# Patient Record
Sex: Female | Born: 1980 | Race: White | Hispanic: No | Marital: Married | Smoking: Never smoker
Health system: Southern US, Community
[De-identification: ages and names within clinical notes are randomized; demographics above are authoritative.]

## PROBLEM LIST (undated history)

## (undated) DIAGNOSIS — K219 Gastro-esophageal reflux disease without esophagitis: Secondary | ICD-10-CM

## (undated) DIAGNOSIS — G43909 Migraine, unspecified, not intractable, without status migrainosus: Secondary | ICD-10-CM

## (undated) DIAGNOSIS — E7800 Pure hypercholesterolemia, unspecified: Secondary | ICD-10-CM

## (undated) DIAGNOSIS — I219 Acute myocardial infarction, unspecified: Secondary | ICD-10-CM

## (undated) DIAGNOSIS — I639 Cerebral infarction, unspecified: Secondary | ICD-10-CM

## (undated) DIAGNOSIS — Z972 Presence of dental prosthetic device (complete) (partial): Secondary | ICD-10-CM

## (undated) HISTORY — PX: CARDIAC SURGERY: SHX584

## (undated) HISTORY — PX: CHOLECYSTECTOMY: SHX55

---

## 2006-05-27 ENCOUNTER — Emergency Department: Payer: Self-pay | Admitting: Emergency Medicine

## 2006-06-02 ENCOUNTER — Inpatient Hospital Stay: Payer: Self-pay | Admitting: Internal Medicine

## 2008-09-21 ENCOUNTER — Ambulatory Visit: Payer: Self-pay | Admitting: Pain Medicine

## 2008-11-18 ENCOUNTER — Ambulatory Visit: Payer: Self-pay | Admitting: Pain Medicine

## 2008-12-06 ENCOUNTER — Ambulatory Visit: Payer: Self-pay | Admitting: Internal Medicine

## 2009-01-11 ENCOUNTER — Ambulatory Visit: Payer: Self-pay | Admitting: Pain Medicine

## 2009-01-25 ENCOUNTER — Ambulatory Visit: Payer: Self-pay | Admitting: Pain Medicine

## 2009-02-18 ENCOUNTER — Encounter: Payer: Self-pay | Admitting: Pain Medicine

## 2009-03-07 ENCOUNTER — Ambulatory Visit: Payer: Self-pay | Admitting: Internal Medicine

## 2009-03-07 ENCOUNTER — Encounter: Payer: Self-pay | Admitting: Pain Medicine

## 2009-05-06 ENCOUNTER — Ambulatory Visit: Payer: Self-pay | Admitting: Physician Assistant

## 2009-07-16 ENCOUNTER — Ambulatory Visit: Payer: Self-pay | Admitting: Internal Medicine

## 2009-11-17 ENCOUNTER — Emergency Department: Payer: Self-pay | Admitting: Emergency Medicine

## 2009-11-27 ENCOUNTER — Ambulatory Visit: Payer: Self-pay | Admitting: Internal Medicine

## 2010-01-23 ENCOUNTER — Ambulatory Visit: Payer: Self-pay | Admitting: Internal Medicine

## 2010-02-05 ENCOUNTER — Ambulatory Visit: Payer: Self-pay | Admitting: Internal Medicine

## 2010-06-20 ENCOUNTER — Observation Stay: Payer: Self-pay

## 2010-07-13 ENCOUNTER — Observation Stay: Payer: Self-pay

## 2010-07-19 ENCOUNTER — Ambulatory Visit: Payer: Self-pay | Admitting: Advanced Practice Midwife

## 2010-07-22 ENCOUNTER — Observation Stay: Payer: Self-pay

## 2010-07-23 ENCOUNTER — Ambulatory Visit: Payer: Self-pay

## 2010-07-31 ENCOUNTER — Observation Stay: Payer: Self-pay | Admitting: Obstetrics & Gynecology

## 2010-08-07 ENCOUNTER — Ambulatory Visit: Payer: Self-pay | Admitting: Advanced Practice Midwife

## 2010-08-07 DIAGNOSIS — I639 Cerebral infarction, unspecified: Secondary | ICD-10-CM

## 2010-08-07 HISTORY — DX: Cerebral infarction, unspecified: I63.9

## 2010-08-14 ENCOUNTER — Observation Stay: Payer: Self-pay

## 2010-08-30 ENCOUNTER — Observation Stay: Payer: Self-pay | Admitting: Obstetrics & Gynecology

## 2010-09-01 ENCOUNTER — Observation Stay: Payer: Self-pay | Admitting: Obstetrics and Gynecology

## 2010-09-09 ENCOUNTER — Inpatient Hospital Stay: Payer: Self-pay

## 2010-11-06 DIAGNOSIS — I5022 Chronic systolic (congestive) heart failure: Secondary | ICD-10-CM | POA: Insufficient documentation

## 2010-11-06 DIAGNOSIS — H5347 Heteronymous bilateral field defects: Secondary | ICD-10-CM | POA: Insufficient documentation

## 2010-12-02 ENCOUNTER — Observation Stay: Payer: Self-pay | Admitting: Internal Medicine

## 2012-02-21 DIAGNOSIS — G890 Central pain syndrome: Secondary | ICD-10-CM | POA: Insufficient documentation

## 2012-02-21 DIAGNOSIS — H532 Diplopia: Secondary | ICD-10-CM | POA: Insufficient documentation

## 2012-05-08 IMAGING — CT CT CHEST W/ CM
1 of 3 series · 15 of 29 positions shown, 19 images · IV contrast (APPLIED)
Comparison: none

REASON FOR EXAM: elev dd syncope Bolivin pills
COMMENTS:

[Series 10: soft tissue · axial · 0.65mm/px · z∈[-204,+27]mm · 15 of 85 slices shown, 19 images]
[im 4/85  mediastinal]
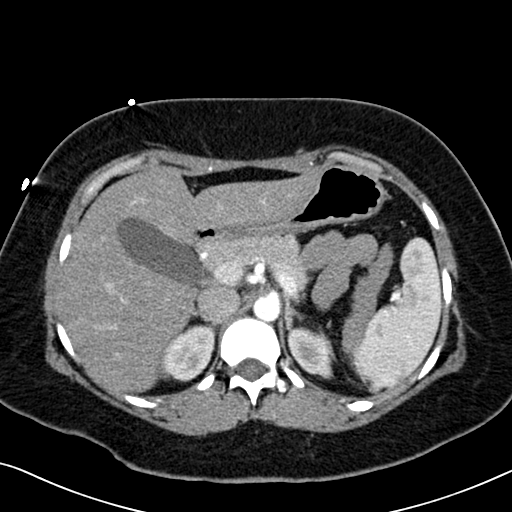
[im 4/85  lung]
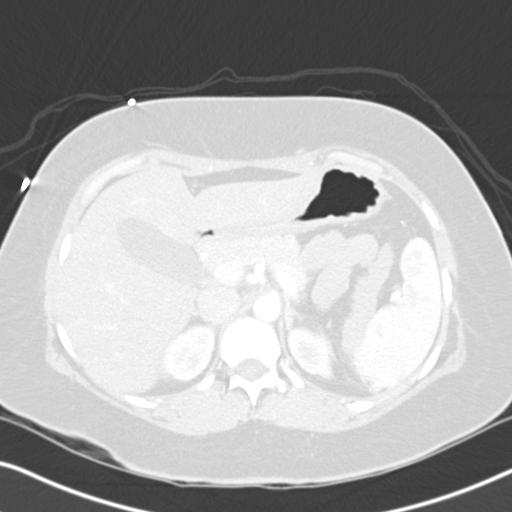
[im 11/85  lung]
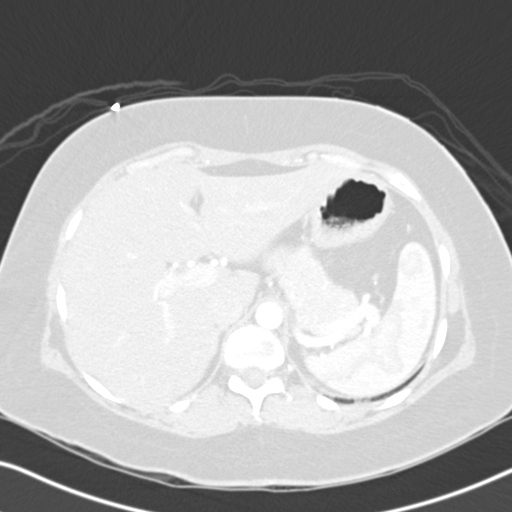
[im 18/85  lung]
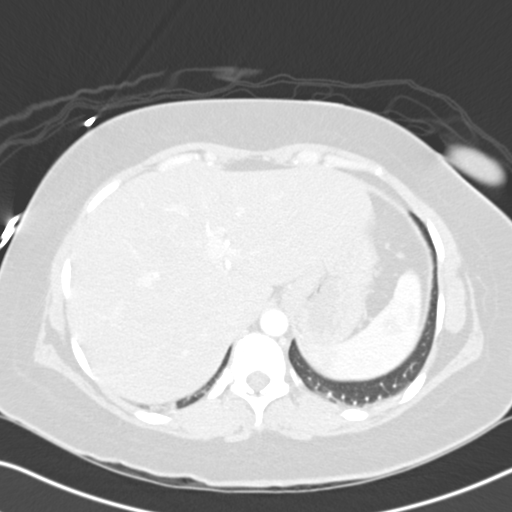
[im 22/85  lung]
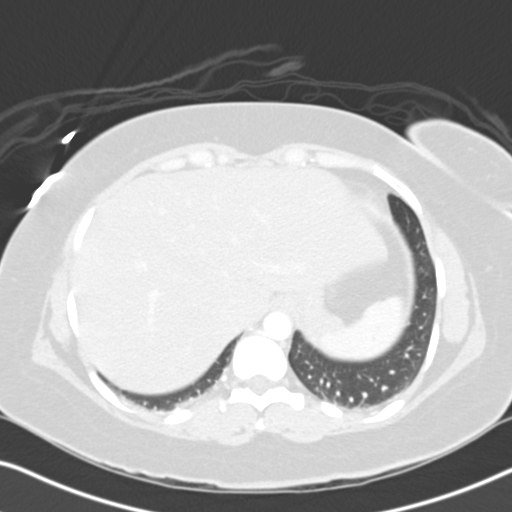
[im 29/85  mediastinal]
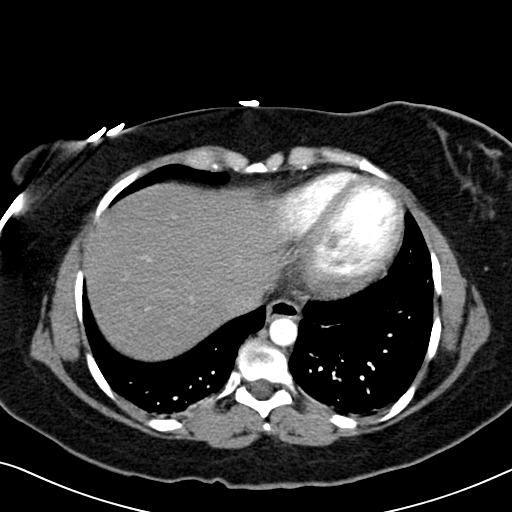
[im 29/85  lung]
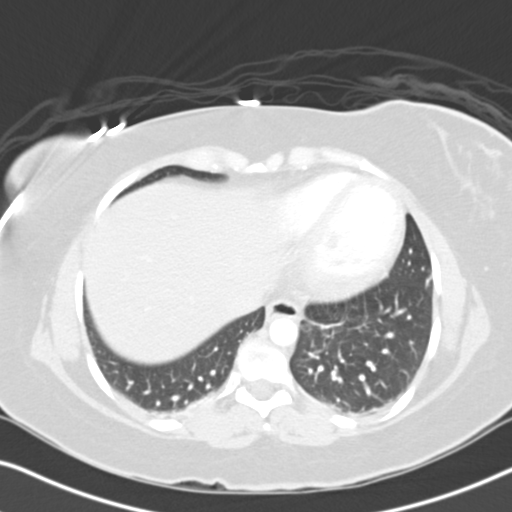
[im 32/85  lung]
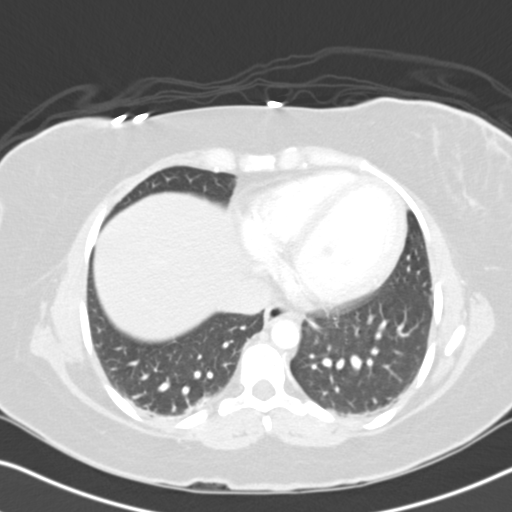
[im 39/85  lung]
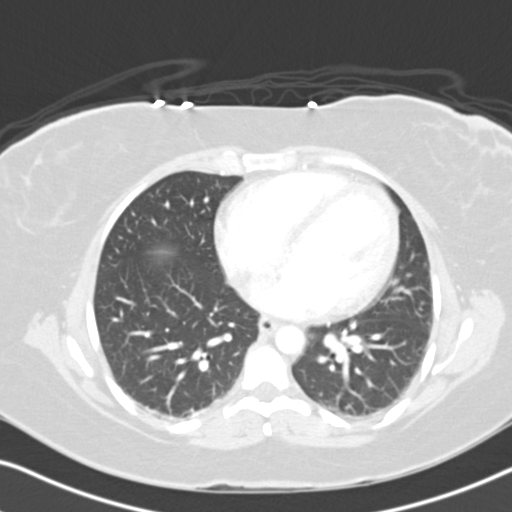
[im 43/85  lung]
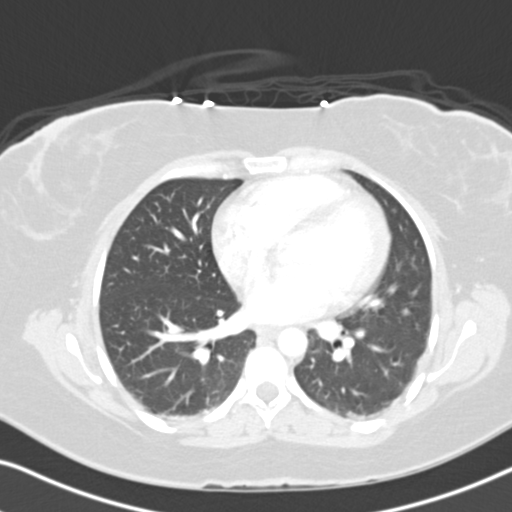
[im 46/85  mediastinal]
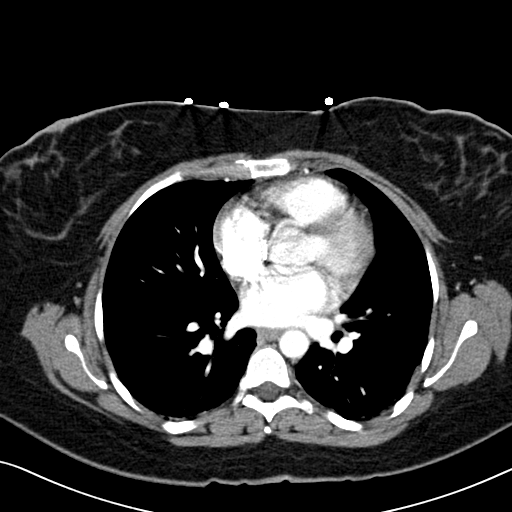
[im 46/85  lung]
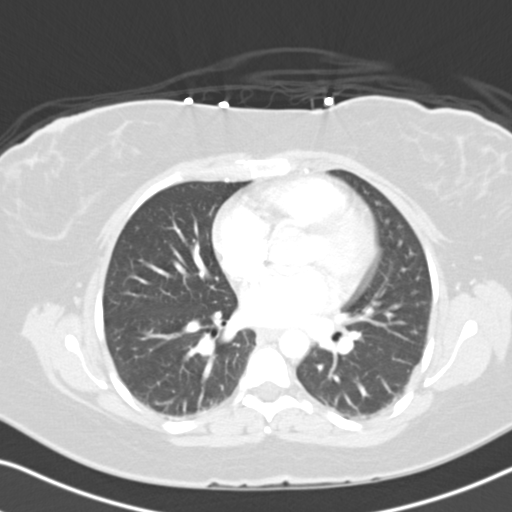
[im 53/85  lung]
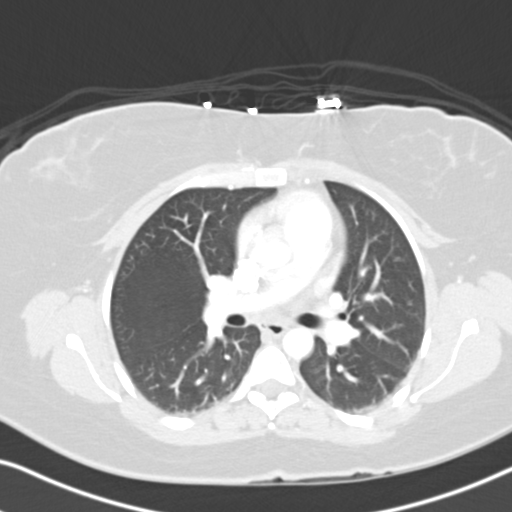
[im 57/85  lung]
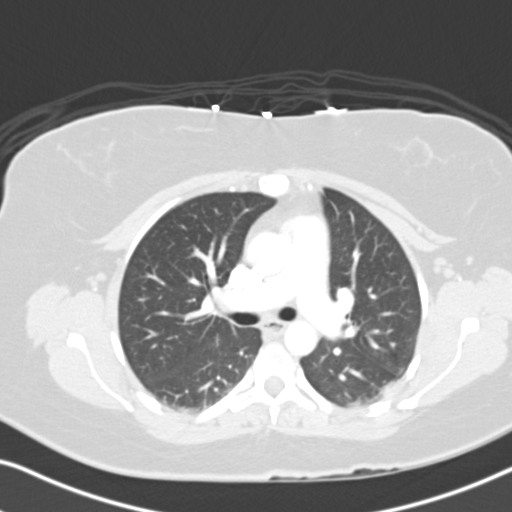
[im 64/85  lung]
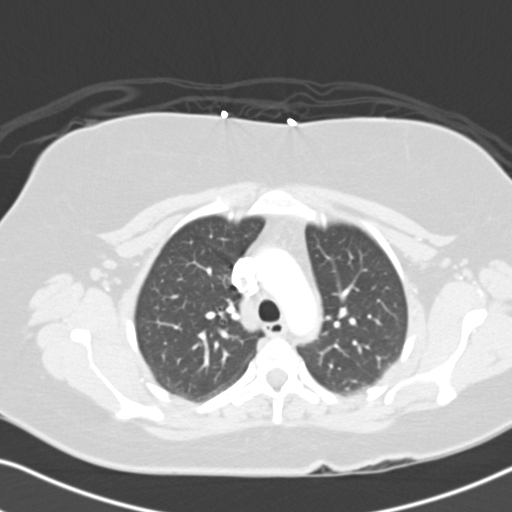
[im 71/85  mediastinal]
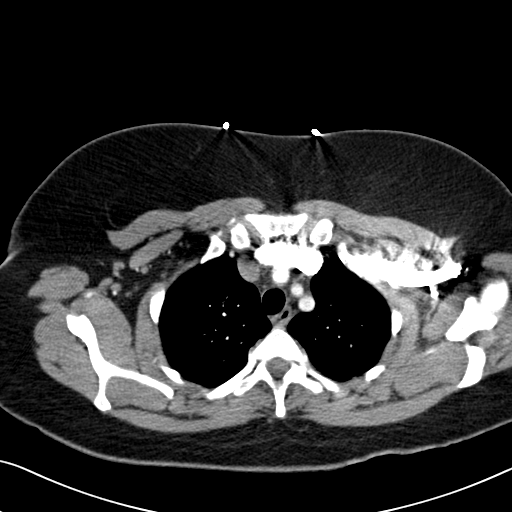
[im 71/85  lung]
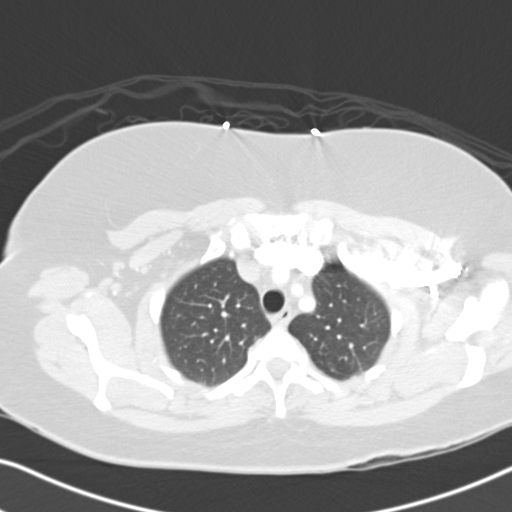
[im 74/85  lung]
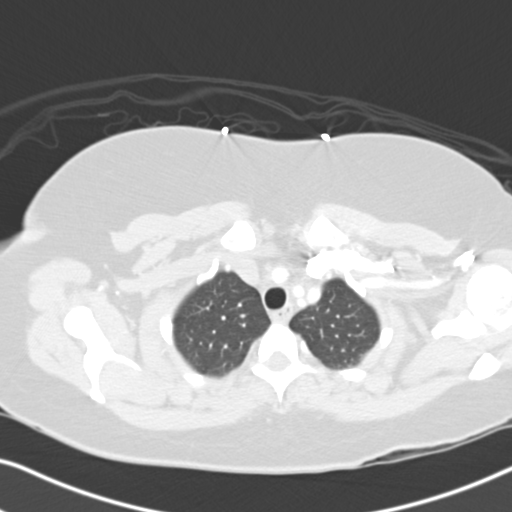
[im 81/85  lung]
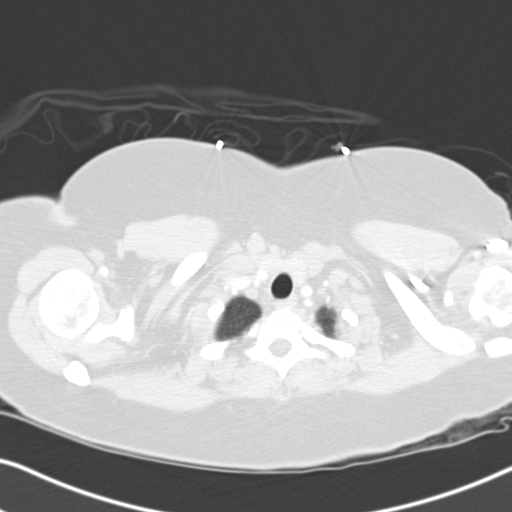

[15 of 29 positions shown; findings below may reference images not displayed]

PROCEDURE:     CT  - CT CHEST (FOR PE) W  - December 02, 2010  [DATE]

RESULT:     Axial CT scanning was performed through the chest at 3 mm
intervals and slice thicknesses following intravenous administration of 100
cc of [id number]. Review of multiplanar reconstructed images was performed
separately on the VIA monitor.

Contrast within the pulmonary arterial tree is normal in appearance. I do
not see evidence of an acute pulmonary embolism. The caliber of the thoracic
aorta is normal. The cardiac chambers are top normal in size. The thoracic
aorta exhibits no acute abnormality. There is a small hiatal hernia. I see
no pathologic sized mediastinal or hilar lymph nodes. There is no pleural
nor pericardial effusion.

At lung window settings I see no interstitial nor alveolar infiltrate. There
is minimal subpleural atelectatic change in the lower lobes posteriorly.
Within the upper abdomen the observed portions of the liver exhibit
decreased density diffusely consistent with fatty infiltration. I see no
adrenal mass. The thoracic vertebral bodies are preserved in height.
IMPRESSION: 1. I do not see evidence of an acute pulmonary embolism. There is no acute
thoracic aortic abnormality.
2. The cardiac chambers are top normal in size. I see no overt evidence of
CHF.
3. There is no evidence of pneumonia. Minimal subpleural atelectatic change
is present bilaterally in the lower lobes.

## 2012-05-08 IMAGING — CT CT HEAD WITHOUT CONTRAST
2 series · 16 of 30 positions shown, 20 images · non-contrast
Comparison: none

REASON FOR EXAM: syncope
COMMENTS:

[Series 2: without · axial · non-contrast · 0.39mm/px · z∈[+358,+478]mm · 13 of 30 slices shown, 17 images]
[im 3/30  brain]
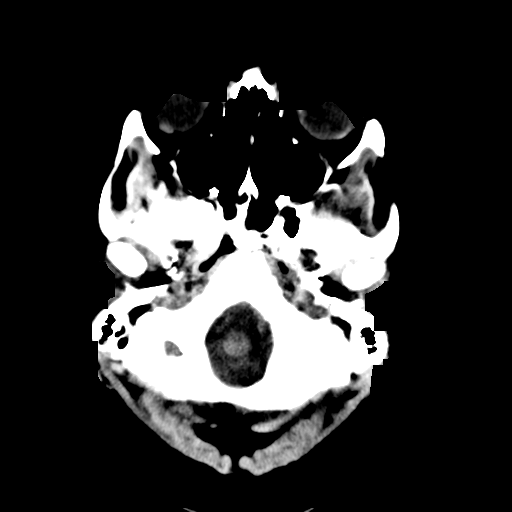
[im 3/30  bone]
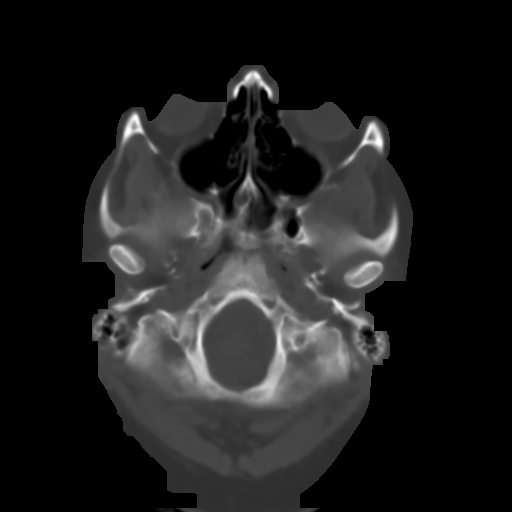
[im 5/30  brain]
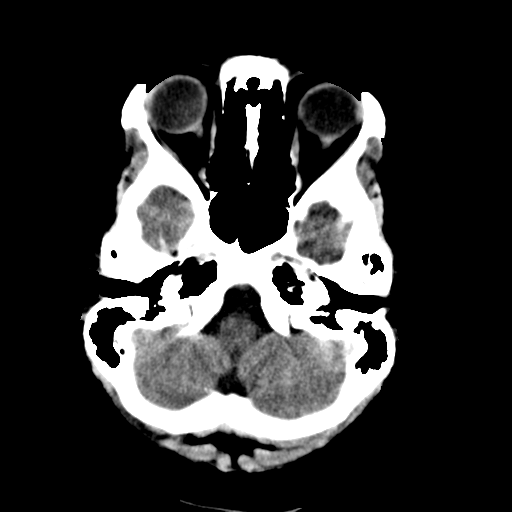
[im 7/30  brain]
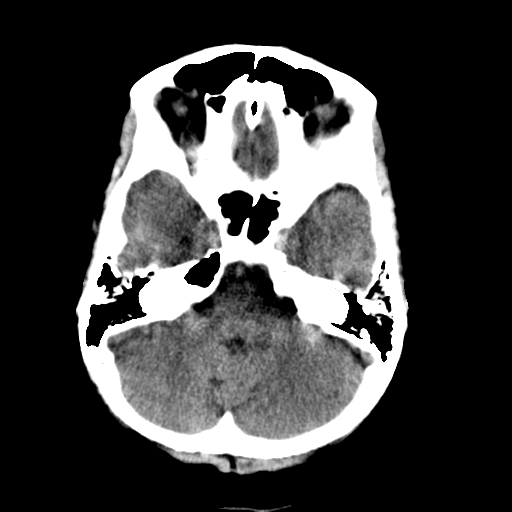
[im 9/30  brain]
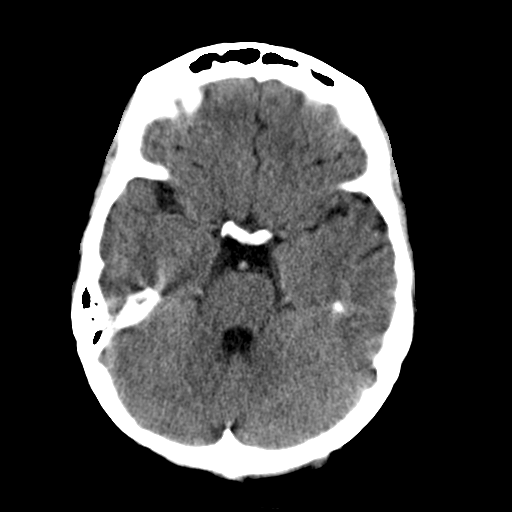
[im 11/30  brain]
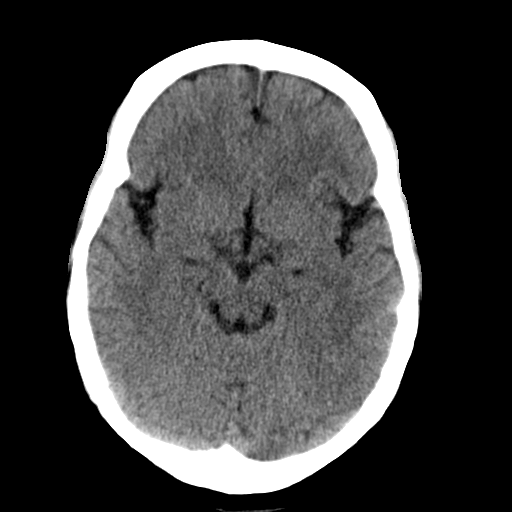
[im 11/30  bone]
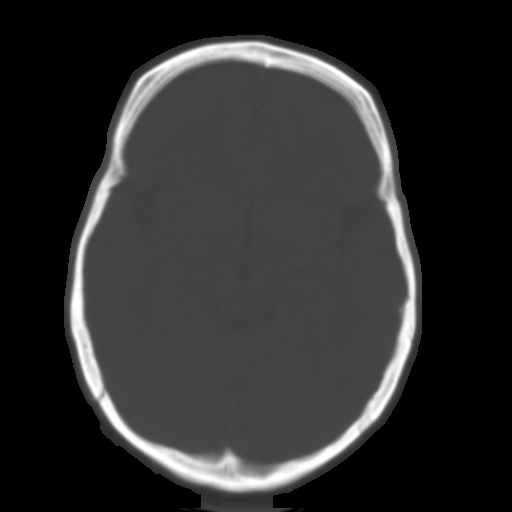
[im 13/30  brain]
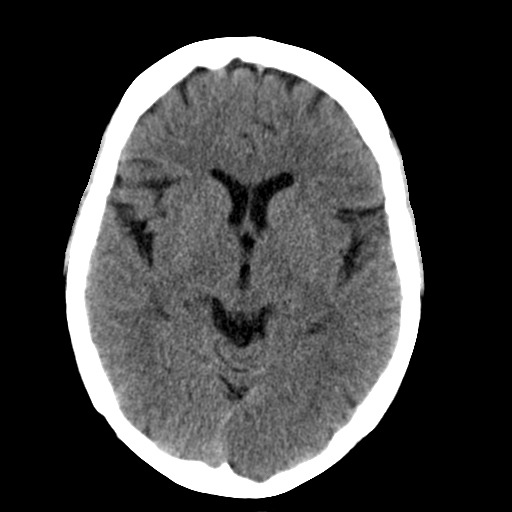
[im 15/30  brain]
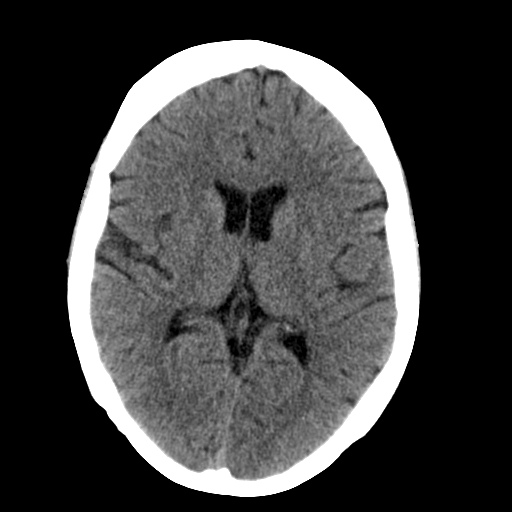
[im 17/30  brain]
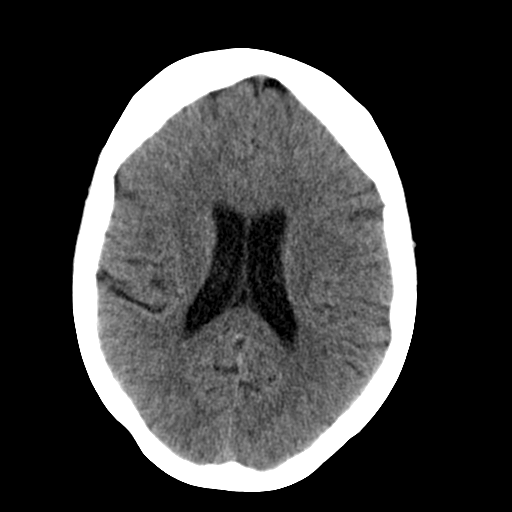
[im 19/30  brain]
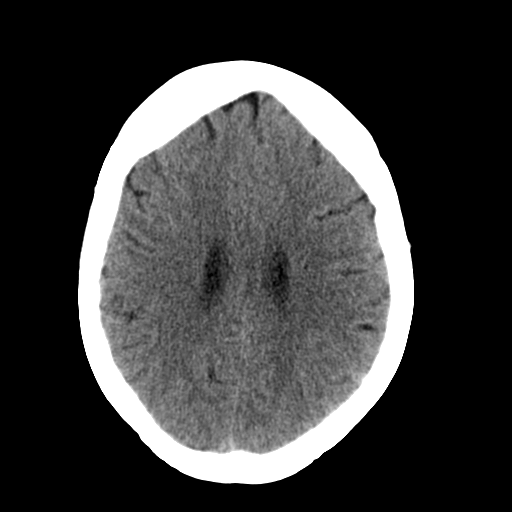
[im 19/30  bone]
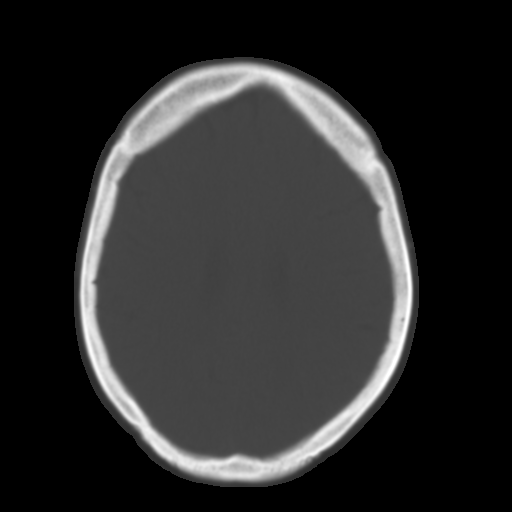
[im 21/30  brain]
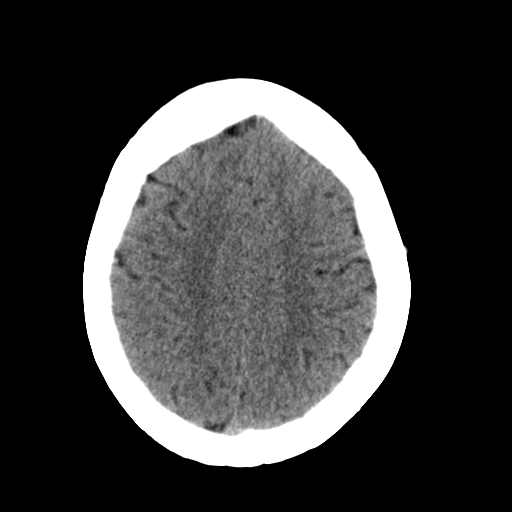
[im 23/30  brain]
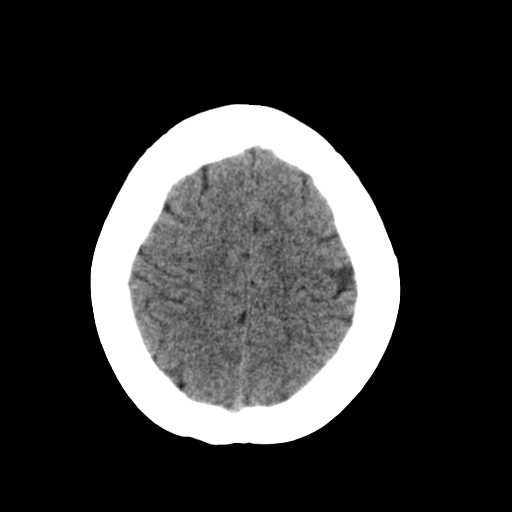
[im 25/30  brain]
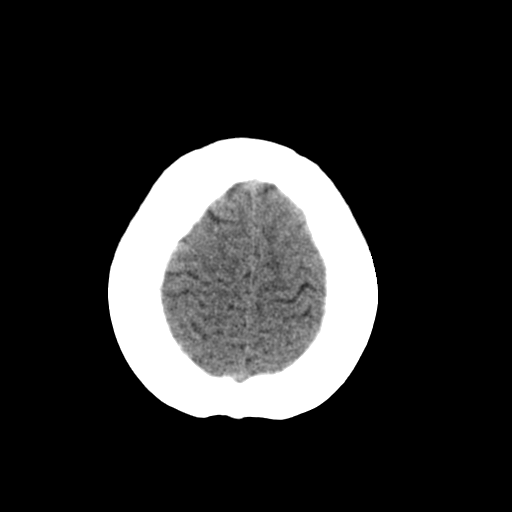
[im 27/30  brain]
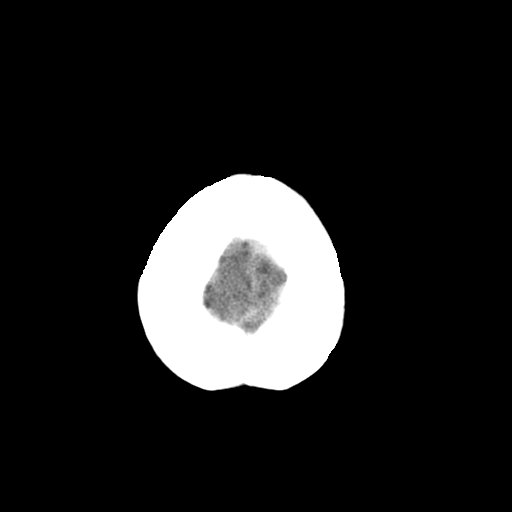
[im 27/30  bone]
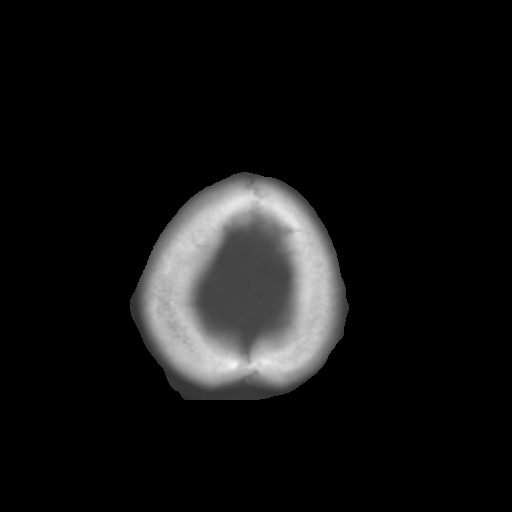

[Series 3: bone · axial · 0.39mm/px · z∈[+358,+398]mm · 3 of 30 slices shown]
[im 3/30  bone]
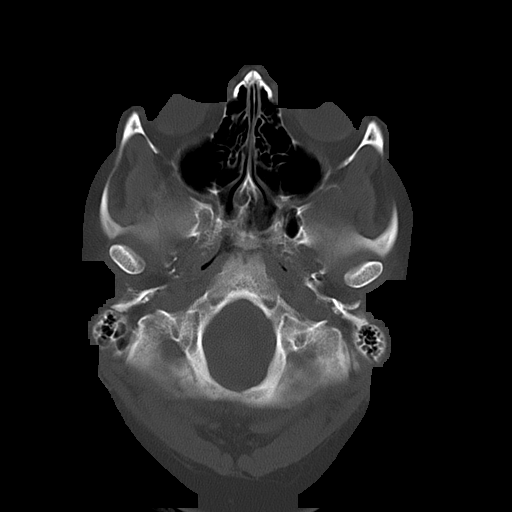
[im 7/30  bone]
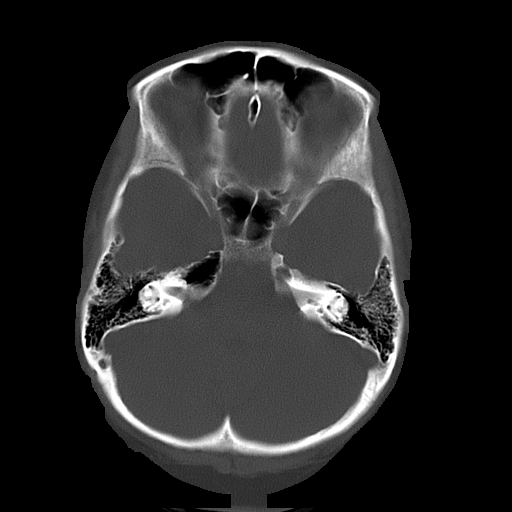
[im 11/30  bone]
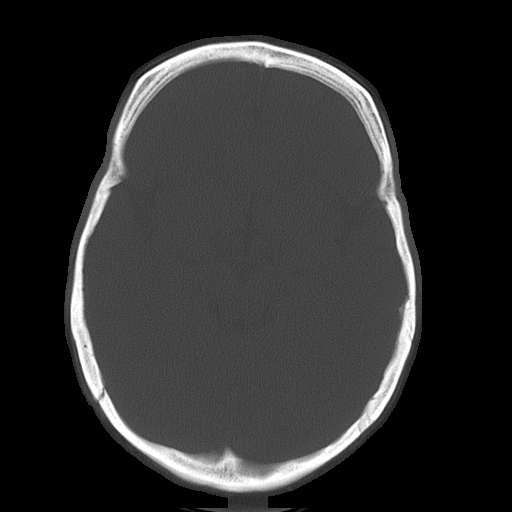

[16 of 30 positions shown; findings below may reference images not displayed]

PROCEDURE:     CT  - CT HEAD WITHOUT CONTRAST  - December 02, 2010  [DATE]

RESULT:     Axial noncontrast CT scanning was performed through the brain at
5 mm intervals and slice thicknesses.

The ventricles are normal in size and position. There is no intracranial
hemorrhage nor intracranial mass effect. There is no evidence of an evolving
ischemic infarction. The cerebellum and brainstem are normal in density.

At bone window settings the observed portions of the paranasal sinuses and
mastoid air cells are clear. I see no evidence of an acute skull fracture.
IMPRESSION: I see no acute intracranial abnormality.

## 2012-08-07 DIAGNOSIS — I219 Acute myocardial infarction, unspecified: Secondary | ICD-10-CM

## 2012-08-07 HISTORY — DX: Acute myocardial infarction, unspecified: I21.9

## 2012-11-18 DIAGNOSIS — Z3040 Encounter for surveillance of contraceptives, unspecified: Secondary | ICD-10-CM | POA: Insufficient documentation

## 2012-11-20 DIAGNOSIS — J302 Other seasonal allergic rhinitis: Secondary | ICD-10-CM | POA: Insufficient documentation

## 2014-07-05 ENCOUNTER — Emergency Department: Payer: Self-pay | Admitting: Internal Medicine

## 2014-11-07 ENCOUNTER — Ambulatory Visit
Admit: 2014-11-07 | Disposition: A | Discharge status: Home / Self Care | Payer: Self-pay | Attending: Internal Medicine | Admitting: Internal Medicine

## 2014-11-13 DIAGNOSIS — Z8673 Personal history of transient ischemic attack (TIA), and cerebral infarction without residual deficits: Secondary | ICD-10-CM | POA: Insufficient documentation

## 2014-11-13 DIAGNOSIS — D689 Coagulation defect, unspecified: Secondary | ICD-10-CM | POA: Insufficient documentation

## 2014-11-13 DIAGNOSIS — I25119 Atherosclerotic heart disease of native coronary artery with unspecified angina pectoris: Secondary | ICD-10-CM | POA: Insufficient documentation

## 2014-11-13 DIAGNOSIS — E669 Obesity, unspecified: Secondary | ICD-10-CM | POA: Insufficient documentation

## 2014-11-13 DIAGNOSIS — Q211 Atrial septal defect, unspecified: Secondary | ICD-10-CM | POA: Insufficient documentation

## 2014-12-10 DIAGNOSIS — M7581 Other shoulder lesions, right shoulder: Secondary | ICD-10-CM | POA: Insufficient documentation

## 2014-12-11 ENCOUNTER — Ambulatory Visit
Admission: EM | Admit: 2014-12-11 | Discharge: 2014-12-11 | Disposition: A | Discharge status: Home / Self Care | Payer: Medicare Other | Attending: Registered Nurse | Admitting: Registered Nurse

## 2014-12-11 ENCOUNTER — Encounter: Payer: Self-pay | Admitting: Emergency Medicine

## 2014-12-11 DIAGNOSIS — W228XXA Striking against or struck by other objects, initial encounter: Secondary | ICD-10-CM | POA: Insufficient documentation

## 2014-12-11 DIAGNOSIS — Y939 Activity, unspecified: Secondary | ICD-10-CM | POA: Diagnosis not present

## 2014-12-11 DIAGNOSIS — S93502A Unspecified sprain of left great toe, initial encounter: Secondary | ICD-10-CM | POA: Insufficient documentation

## 2014-12-11 DIAGNOSIS — J029 Acute pharyngitis, unspecified: Secondary | ICD-10-CM | POA: Insufficient documentation

## 2014-12-11 DIAGNOSIS — Z79899 Other long term (current) drug therapy: Secondary | ICD-10-CM | POA: Diagnosis not present

## 2014-12-11 DIAGNOSIS — Y999 Unspecified external cause status: Secondary | ICD-10-CM | POA: Insufficient documentation

## 2014-12-11 DIAGNOSIS — Z7982 Long term (current) use of aspirin: Secondary | ICD-10-CM | POA: Diagnosis not present

## 2014-12-11 DIAGNOSIS — M79675 Pain in left toe(s): Secondary | ICD-10-CM | POA: Diagnosis present

## 2014-12-11 DIAGNOSIS — H671 Otitis media in diseases classified elsewhere, right ear: Secondary | ICD-10-CM | POA: Diagnosis not present

## 2014-12-11 DIAGNOSIS — Y929 Unspecified place or not applicable: Secondary | ICD-10-CM | POA: Diagnosis not present

## 2014-12-11 DIAGNOSIS — J301 Allergic rhinitis due to pollen: Secondary | ICD-10-CM | POA: Insufficient documentation

## 2014-12-11 DIAGNOSIS — H6691 Otitis media, unspecified, right ear: Secondary | ICD-10-CM | POA: Insufficient documentation

## 2014-12-11 DIAGNOSIS — H9203 Otalgia, bilateral: Secondary | ICD-10-CM | POA: Diagnosis present

## 2014-12-11 HISTORY — DX: Pure hypercholesterolemia, unspecified: E78.00

## 2014-12-11 HISTORY — DX: Acute myocardial infarction, unspecified: I21.9

## 2014-12-11 HISTORY — DX: Migraine, unspecified, not intractable, without status migrainosus: G43.909

## 2014-12-11 HISTORY — DX: Cerebral infarction, unspecified: I63.9

## 2014-12-11 LAB — RAPID STREP SCREEN (MED CTR MEBANE ONLY): Streptococcus, Group A Screen (Direct): NEGATIVE

## 2014-12-11 MED ORDER — DOXYCYCLINE HYCLATE 100 MG PO CAPS: 100.0000 mg | ORAL_CAPSULE | Freq: Two times a day (BID) | ORAL | Status: DC

## 2014-12-11 MED ORDER — FIRST-DUKES MOUTHWASH MT SUSP: 15.0000 mL | Freq: Three times a day (TID) | OROMUCOSAL | Status: AC

## 2014-12-11 NOTE — Discharge Instructions (Signed)
Allergic Rhinitis Allergic rhinitis is when the mucous membranes in the nose respond to allergens. Allergens are particles in the air that cause your body to have an allergic reaction. This causes you to release allergic antibodies. Through a chain of events, these eventually cause you to release histamine into the blood stream. Although meant to protect the body, it is this release of histamine that causes your discomfort, such as frequent sneezing, congestion, and an itchy, runny nose.  CAUSES  Seasonal allergic rhinitis (hay fever) is caused by pollen allergens that may come from grasses, trees, and weeds. Year-round allergic rhinitis (perennial allergic rhinitis) is caused by allergens such as house dust mites, pet dander, and mold spores.  SYMPTOMS   Nasal stuffiness (congestion).  Itchy, runny nose with sneezing and tearing of the eyes. DIAGNOSIS  Your health care provider can help you determine the allergen or allergens that trigger your symptoms. If you and your health care provider are unable to determine the allergen, skin or blood testing may be used. TREATMENT  Allergic rhinitis does not have a cure, but it can be controlled by:  Medicines and allergy shots (immunotherapy).  Avoiding the allergen. Hay fever may often be treated with antihistamines in pill or nasal spray forms. Antihistamines block the effects of histamine. There are over-the-counter medicines that may help with nasal congestion and swelling around the eyes. Check with your health care provider before taking or giving this medicine.  If avoiding the allergen or the medicine prescribed do not work, there are many new medicines your health care provider can prescribe. Stronger medicine may be used if initial measures are ineffective. Desensitizing injections can be used if medicine and avoidance does not work. Desensitization is when a patient is given ongoing shots until the body becomes less sensitive to the allergen.  Make sure you follow up with your health care provider if problems continue. HOME CARE INSTRUCTIONS It is not possible to completely avoid allergens, but you can reduce your symptoms by taking steps to limit your exposure to them. It helps to know exactly what you are allergic to so that you can avoid your specific triggers. SEEK MEDICAL CARE IF:   You have a fever.  You develop a cough that does not stop easily (persistent).  You have shortness of breath.  You start wheezing.  Symptoms interfere with normal daily activities. Document Released: 04/18/2001 Document Revised: 07/29/2013 Document Reviewed: 03/31/2013 [facility] Patient Information 2015 [place], [place]. This information is not intended to replace advice given to you by your health care provider. Make sure you discuss any questions you have with your health care provider. Otitis Media With Effusion Otitis media with effusion is the presence of fluid in the middle ear. This is a common problem in children, which often follows ear infections. It may be present for weeks or longer after the infection. Unlike an acute ear infection, otitis media with effusion refers only to fluid behind the ear drum and not infection. Children with repeated ear and sinus infections and allergy problems are the most likely to get otitis media with effusion. CAUSES  The most frequent cause of the fluid buildup is dysfunction of the eustachian tubes. These are the tubes that drain fluid in the ears to the back of the nose (nasopharynx). SYMPTOMS   The main symptom of this condition is hearing loss. As a result, you or your child may:  Listen to the TV at a loud volume.  Not respond to questions.  Ask "  what" often when spoken to.  Mistake or confuse one sound or word for another.  There may be a sensation of fullness or pressure but usually not pain. DIAGNOSIS   Your health care provider will diagnose this condition by examining you or your  child's ears.  Your health care provider may test the pressure in you or your child's ear with a tympanometer.  A hearing test may be conducted if the problem persists. TREATMENT   Treatment depends on the duration and the effects of the effusion.  Antibiotics, decongestants, nose drops, and cortisone-type drugs (tablets or nasal spray) may not be helpful.  Children with persistent ear effusions may have delayed language or behavioral problems. Children at risk for developmental delays in hearing, learning, and speech may require referral to a specialist earlier than children not at risk.  You or your child's health care provider may suggest a referral to an ear, nose, and throat surgeon for treatment. The following may help restore normal hearing:  Drainage of fluid.  Placement of ear tubes (tympanostomy tubes).  Removal of adenoids (adenoidectomy). HOME CARE INSTRUCTIONS   Avoid secondhand smoke.  Infants who are breastfed are less likely to have this condition.  Avoid feeding infants while they are lying flat.  Avoid known environmental allergens.  Avoid people who are sick. SEEK MEDICAL CARE IF:   Hearing is not better in 3 months.  Hearing is worse.  Ear pain.  Drainage from the ear.  Dizziness. MAKE SURE YOU:   Understand these instructions.  Will watch your condition.  Will get help right away if you are not doing well or get worse. Document Released: 08/31/2004 Document Revised: 12/08/2013 Document Reviewed: 02/18/2013 [facility] Patient Information 2015 [employer]. This information is not intended to replace advice given to you by your health care provider. Make sure you discuss any questions you have with your health care provider. Turf Toe, with Rehab Injury to the base of the big toe (first metatarsal phalangeal joint) that causes damage to the joint capsule and ligaments is known as turf toe. Turf toe commonly occurs on the bottom side of the  joint. SYMPTOMS   Pain, tenderness, inflammation and/or bruising around the big toe (contusion).  Pain that worsens with movement of the big toe, specifically when raising (extending) the toe.  Inability to walk properly on the affected foot, which causes one to limp. CAUSES  Turf toe is caused by a force being placed on the joint capsule and ligaments that is greater than they can withstand. Common mechanisms of injury include:  Repetitive and/or strenuous extension of the big toe (standing on tiptoes).  Explosive running starts (sprinters).  "Stubbing" the big toe.  Another player landing on your foot. RISK INCREASES WITH:  Previous toe injury.  Having a long first toe.  Flat feet.  Arthritis of the great toe.  Improperly fitted shoes or shoes that are not appropriate for a given activity.  Family history of foot abnormalities.  Activities that involve explosive running starts, standing on tiptoes, or jumping. PREVENTION  Wear properly fitted shoes that are appropriate for the sport or activity.  Protect the first toe by taping it to reduce motion.  Maintain physical fitness:  Strength, flexibility, and endurance.  Cardiovascular fitness. PROGNOSIS  If treated properly, the symptoms of turf toe usually resolve with non-surgical (conservative) treatment. Occasionally, surgery is necessary. RELATED COMPLICATIONS  Recurrent symptoms that result in a chronic problem.  Inability to compete in athletics.  Prolonged healing  time, if improperly treated or re-injured.  Other foot injuries that occur due to protecting the first toe from pain.  Loss of motion in the first toe (hallux rigidus).  Bunion (hallux valgus). TREATMENT  Treatment initially involves resting from any activities that aggravate the symptoms, and the use of ice and medications to help reduce pain and inflammation. The use of range-of-motion exercises may help reduce pain with activity. It is  important that you wear properly fitted shoes with a stiff sole and a wide toe box, in order to reduce the pressure on the first toe. Protecting your big toe by taping it to restrict movement may allow you to return to sports earlier without pain or discomfort. If the condition becomes chronic, then your caregiver may recommend a corticosteroid injection to help reduce inflammation. If symptoms persist despite non-surgical treatment, then surgery may be recommended. MEDICATION  If pain medication is necessary, then nonsteroidal anti-inflammatory medications, such as aspirin and ibuprofen, or other minor pain relievers, such as acetaminophen, are often recommended.  Do not take pain medication for 7 days before surgery.  Prescription pain relievers may be given if deemed necessary by your caregiver. Use only as directed and only as much as you need.  Ointments applied to the skin may be helpful.  Corticosteroid injections may be given by your caregiver. These injections should be reserved for the most serious cases, because they may only be given a certain number of times. HEAT AND COLD  Cold treatment (icing) relieves pain and reduces inflammation. Cold treatment should be applied for 10 to 15 minutes every 2 to 3 hours for inflammation and pain and immediately after any activity that aggravates your symptoms. Use ice packs or massage the area with a piece of ice (ice massage).  Heat treatment may be used prior to performing the stretching and strengthening activities prescribed by your caregiver, physical therapist, or athletic trainer. Use a heat pack or soak the injury in warm water. SEEK MEDICAL CARE IF:  Treatment seems to offer no benefit, or the condition worsens.  Any medications produce adverse side effects. EXERCISES RANGE OF MOTION (ROM) AND STRETCHING EXERCISES - Turf Toe These exercises may help you when beginning to rehabilitate your injury. Your symptoms may resolve with or  without further involvement from your physician, physical therapist, or athletic trainer. While completing these exercises, remember:  Restoring tissue flexibility helps normal motion to return to the joints. This allows healthier, less painful movement and activity.  An effective stretch should be held for at least 30 seconds.  A stretch should never be painful. You should only feel a gentle lengthening or release in the stretched tissue. RANGE OF MOTION - Toe Extension, Flexion  Sit with your right / left leg crossed over your opposite knee.  Grasp your toes and gently pull them back toward the top of your foot. You should feel a stretch on the bottom of your toes and/or foot.  Hold this stretch for __________ seconds.  Now, gently pull your toes toward the bottom of your foot. You should feel a stretch on the top of your toes and or foot.  Hold this stretch for __________ seconds. Repeat __________ times. Complete this stretch __________ times per day. RANGE OF MOTION - Ankle Plantar Flexion  Sit with your right / left leg crossed over your opposite knee.  Use your opposite hand to pull the top of your foot and toes toward you.  You should feel a gentle stretch on  the top of your foot/ankle. Hold this position for __________ seconds. Repeat __________ times. Complete __________ times per day. STRENGTHENING EXERCISES - Turf Toe These exercises may help you when beginning to rehabilitate your injury. They may resolve your symptoms with or without further involvement from your physician, physical therapist, or athletic trainer. While completing these exercises, remember:  Muscles can gain both the endurance and the strength needed for everyday activities through controlled exercises.  Complete these exercises as instructed by your physician, physical therapist, or athletic trainer. Progress with the resistance and repetition exercises only as your caregiver advises.  You may  experience muscle soreness or fatigue, but the pain or discomfort you are trying to eliminate should never worsen during these exercises. If this pain does worsen, stop and make certain you are following the directions exactly. If the pain is still present after adjustments, discontinue the exercise until you can discuss the trouble with your clinician. STRENGTH - Towel Curls  Sit in a chair positioned on a non-carpeted surface.  Place your foot on a towel, keeping your heel on the floor.  Pull the towel toward your heel by only curling your toes. Keep your heel on the floor.  If instructed by your physician, physical therapist or athletic trainer, add ____________________ at the end of the towel. Repeat __________ times. Complete this exercise __________ times per day. Document Released: 07/24/2005 Document Revised: 12/08/2013 Document Reviewed: 11/05/2008 [facility] Patient Information 2015 [place], [place]. This information is not intended to replace advice given to you by your health care provider. Make sure you discuss any questions you have with your health care provider. Pharyngitis Pharyngitis is redness, pain, and swelling (inflammation) of your pharynx.  CAUSES  Pharyngitis is usually caused by infection. Most of the time, these infections are from viruses (viral) and are part of a cold. However, sometimes pharyngitis is caused by bacteria (bacterial). Pharyngitis can also be caused by allergies. Viral pharyngitis may be spread from person to person by coughing, sneezing, and personal items or utensils (cups, forks, spoons, toothbrushes). Bacterial pharyngitis may be spread from person to person by more intimate contact, such as kissing.  SIGNS AND SYMPTOMS  Symptoms of pharyngitis include:   Sore throat.   Tiredness (fatigue).   Low-grade fever.   Headache.  Joint pain and muscle aches.  Skin rashes.  Swollen lymph nodes.  Plaque-like film on throat or tonsils (often  seen with bacterial pharyngitis). DIAGNOSIS  Your health care provider will ask you questions about your illness and your symptoms. Your medical history, along with a physical exam, is often all that is needed to diagnose pharyngitis. Sometimes, a rapid strep test is done. Other lab tests may also be done, depending on the suspected cause.  TREATMENT  Viral pharyngitis will usually get better in 3-4 days without the use of medicine. Bacterial pharyngitis is treated with medicines that kill germs (antibiotics).  HOME CARE INSTRUCTIONS   Drink enough water and fluids to keep your urine clear or pale yellow.   Only take over-the-counter or prescription medicines as directed by your health care provider:   If you are prescribed antibiotics, make sure you finish them even if you start to feel better.   Do not take aspirin.   Get lots of rest.   Gargle with 8 oz of salt water ( tsp of salt per 1 qt of water) as often as every 1-2 hours to soothe your throat.   Throat lozenges (if you are not at risk for  choking) or sprays may be used to soothe your throat. SEEK MEDICAL CARE IF:   You have large, tender lumps in your neck.  You have a rash.  You cough up green, yellow-brown, or bloody spit. SEEK IMMEDIATE MEDICAL CARE IF:   Your neck becomes stiff.  You drool or are unable to swallow liquids.  You vomit or are unable to keep medicines or liquids down.  You have severe pain that does not go away with the use of recommended medicines.  You have trouble breathing (not caused by a stuffy nose). MAKE SURE YOU:   Understand these instructions.  Will watch your condition.  Will get help right away if you are not doing well or get worse. Document Released: 07/24/2005 Document Revised: 05/14/2013 Document Reviewed: 03/31/2013 [facility] Patient Information 2015 [place], [place]. This information is not intended to replace advice given to you by your health care provider. Make sure  you discuss any questions you have with your health care provider.

## 2014-12-11 NOTE — ED Provider Notes (Addendum)
CSN: [id number]     Arrival date & time 12/11/14  1202 History   None    Chief Complaint  Patient presents with  . Otalgia    both ears  . Toe Pain    left 1st toe   (Consider location/radiation/quality/duration/timing/severity/associated sxs/prior Treatment) The history is provided by the patient.  33y/o disabled caucasian female with left great toe pain after hitting it on baby gate middle of night, elevated last night pain decreased but worsens with weight bearing, slight swelling denied bruising or deformity.  Bilateral ear pain continues since previous visit April 2016 with Dr Dayton ScrapeMurray for right shoulder pain, otitis right and sinusitis.  Patient reported she is using her prescription nose spray once a day without any relief of symptoms.  Denied fever, chills, rash, ear discharge, productive cough, chest pain, nausea, vomiting, diarrhea.  Past Medical History  Diagnosis Date  . Stroke   . MI (myocardial infarction)   . Migraines   . Hypercholesteremia    Past Surgical History  Procedure Laterality Date  . Cardiac surgery     Family History  Problem Relation Age of Onset  . Heart attack Father    History  Substance Use Topics  . Smoking status: Never Smoker   . Smokeless tobacco: Never Used  . Alcohol Use: No   OB History    No data available     Review of Systems  Constitutional: Negative.   HENT: Positive for congestion, ear pain, postnasal drip and sore throat.   Eyes: Negative.   Respiratory: Positive for cough. Negative for shortness of breath and wheezing.   Cardiovascular: Negative.   Gastrointestinal: Negative.   Genitourinary: Negative.   Musculoskeletal: Negative.   Skin: Negative.   Allergic/Immunologic: Positive for environmental allergies.  Neurological: Negative.   Hematological: Negative.     Allergies  Amoxicillin; Penicillins; Cefdinir; and Augmentin  Home Medications   Prior to Admission medications   Medication Sig Start Date End Date  Taking? Authorizing Provider  aspirin 325 MG tablet Take 325 mg by mouth daily.   Yes Historical Provider, MD  atorvastatin (LIPITOR) 40 MG tablet Take 40 mg by mouth daily.   Yes Historical Provider, MD  butalbital-acetaminophen-caffeine (FIORICET, ESGIC) 50-325-40 MG per tablet Take 1 tablet by mouth 2 (two) times daily as needed for headache.   Yes Historical Provider, MD  gabapentin (NEURONTIN) 600 MG tablet Take 600 mg by mouth 3 (three) times daily.   Yes Historical Provider, MD  losartan (COZAAR) 25 MG tablet Take 25 mg by mouth daily.   Yes Historical Provider, MD  metoprolol (LOPRESSOR) 50 MG tablet Take 50 mg by mouth daily.   Yes Historical Provider, MD  nitroGLYCERIN (NITROSTAT) 0.4 MG SL tablet Place 0.4 mg under the tongue every 5 (five) minutes as needed for chest pain.   Yes Historical Provider, MD  warfarin (COUMADIN) 5 MG tablet Take 5 mg by mouth daily at 6 PM.   Yes Historical Provider, MD  Diphenhyd-Hydrocort-Nystatin (FIRST-DUKES MOUTHWASH) SUSP Use as directed 15 mLs in the mouth or throat 3 (three) times daily. 12/11/14 12/16/14  Barbaraann Barthelina A Betancourt, NP  doxycycline (VIBRAMYCIN) 100 MG capsule Take 1 capsule (100 mg total) by mouth 2 (two) times daily. 12/11/14   Barbaraann Barthelina A Betancourt, NP   BP 120/76 mmHg  Pulse 75  Temp(Src) 96.6 F (35.9 C) (Tympanic)  Resp 16  Ht 5\' 3"  (1.6 m)  Wt 197 lb (89.359 kg)  BMI 34.91 kg/m2  SpO2 95%  LMP  11/23/2014 (Exact Date) Physical Exam  Constitutional: She is oriented to person, place, and time. Vital signs are normal. She appears well-developed and well-nourished. No distress.  HENT:  Head: Normocephalic and atraumatic.  Right Ear: Hearing, external ear and ear canal normal. Tympanic membrane is injected and erythematous. A middle ear effusion is present.  Left Ear: Hearing, tympanic membrane, external ear and ear canal normal.  Nose: Mucosal edema and rhinorrhea present. No nose lacerations, sinus tenderness or nasal deformity. No  epistaxis. Right sinus exhibits no maxillary sinus tenderness and no frontal sinus tenderness. Left sinus exhibits no maxillary sinus tenderness and no frontal sinus tenderness.  Mouth/Throat: Uvula is midline and mucous membranes are normal. Posterior oropharyngeal edema and posterior oropharyngeal erythema present. No oropharyngeal exudate or tonsillar abscesses.  Right TM with injection vasculature and erythema slight opacity bilateral TMs air fluid level; tonsils edema erythema macular rash oropharynx nasal turbinates edema with erythema clear discharge  Eyes: Conjunctivae, EOM and lids are normal. Pupils are equal, round, and reactive to light. Right eye exhibits no discharge. Left eye exhibits no discharge. No scleral icterus.  Neck: Trachea normal and normal range of motion. Neck supple. No JVD present. No tracheal deviation present. No thyromegaly present.  Cardiovascular: Normal rate, regular rhythm, normal heart sounds and intact distal pulses.  Exam reveals no gallop and no friction rub.   No murmur heard. Pulmonary/Chest: Effort normal and breath sounds normal. No respiratory distress. She has no wheezes. She has no rales. She exhibits no tenderness.  Abdominal: Soft. Bowel sounds are normal. She exhibits no distension.  Musculoskeletal: Normal range of motion. She exhibits no edema.       Feet:  Lymphadenopathy:    She has no cervical adenopathy.  Neurological: She is alert and oriented to person, place, and time.  Skin: Skin is warm, dry and intact. No rash noted. She is not diaphoretic. No erythema. No pallor.  Psychiatric: She has a normal mood and affect. Her speech is normal and behavior is normal. Judgment and thought content normal. Cognition and memory are normal.    ED Course  Procedures (including critical care time) Labs Review Labs Reviewed  RAPID STREP SCREEN  CULTURE, GROUP A STREP [facility])  Patient notified rapid strep negative.  Has taken azithromycin and  doxycycline in past without side effects.  Discussed with patient both antibiotics may increase PT time and to follow up for lab testing Monday at her coumadin clinic.  Continue Rx nasal spray, add afrin 1 spray each nostril twice a day for 3 days only, nasal saline 2 sprays each nostril q2h while awake/after being outside for nasal congestion.  Doxycycline 100mg  po BID x 7 days otitis acute right.  Will contact patient with throat culture results this weekend once available.  Discussed doxycyline would provide coverage for strep throat also with patient.  Treatment as ordered.  Symptomatic therapy suggested fluids and rest.  Call or return to clinic as needed if these symptoms worsen or fail to improve as anticipated. [employer] handout on otitis media given to patient. School/work excuse note given to patient for 24 hours.  Usually no specific medical treatment is needed if a virus is causing the sore throat.  The throat most often gets better on its own within 5 to 7 days. Dukes mouthwash 15ml po gargle swallow TID prn sore throat.   Antibiotic medicine does not cure viral pharyngitis.   For acute pharyngitis caused by bacteria, your healthcare provider will prescribe an antibiotic.  .Marland Kitchen  Do not smoke.  Marland Kitchen Avoid secondhand smoke and other air pollutants.  . Use a cool mist humidifier to add moisture to the air.  . Get plenty of rest.  . You may want to rest your throat by talking less and eating a diet that is mostly liquid or soft for a day or two.   Marland Kitchen Nonprescription throat lozenges and mouthwashes should help relieve the soreness.   . Gargling with warm saltwater and drinking warm liquids may help.  (You can make a saltwater solution by adding 1/4 teaspoon of salt to 8 ounces, or 240 mL, of warm water.)  . A nonprescription pain reliever such as aspirin, acetaminophen, or ibuprofen may ease general aches and pains.   FOLLOW UP with clinic provider if no improvements in the next 7-10 days.  Patient  verbalized understanding of instructions and agreed with plan of care. P2:  Hand washing and diet.  Patient was instructed to rest, ice and elevate the left foot as much as possible.  Patient did not want xray at this time.  Activity as tolerated and work on ROM exercises.  Wear supportive footwear  Follow up with PCM if symptoms persist greater than 4 weeks for re-evaluation. Patient verbalized agreement and understanding of treatment plan.   Imaging Review No results found.   MDM   1. Sprain of toe, great, left, initial encounter   2. Allergic rhinitis due to pollen   3. Otitis media in diseases classified elsewhere, right ear   4. Acute pharyngitis, unspecified pharyngitis type    16 Dec 2014 at 0756 Telephone message left for patient throat culture negative/normal and to contact clinic to verify message receipt and give symptom update.   Barbaraann Barthel, NP 12/11/14 1724  Barbaraann Barthel, NP 12/16/14 [phone]

## 2014-12-11 NOTE — ED Notes (Signed)
Patient c/o left 1st toe since last night.  Patient states that she was stepping over he baby gate and hit her left 1st toe.  Patient also c/o bilateral earache for a week.  Patient denies fevers.

## 2014-12-14 DIAGNOSIS — G43009 Migraine without aura, not intractable, without status migrainosus: Secondary | ICD-10-CM | POA: Insufficient documentation

## 2014-12-14 LAB — CULTURE, GROUP A STREP (THRC)

## 2015-02-12 DIAGNOSIS — Z8349 Family history of other endocrine, nutritional and metabolic diseases: Secondary | ICD-10-CM | POA: Insufficient documentation

## 2015-05-04 ENCOUNTER — Ambulatory Visit
Admit: 2015-05-04 | Discharge: 2015-05-04 | Disposition: A | Discharge status: Home / Self Care | Payer: Medicare Other | Attending: Emergency Medicine | Admitting: Emergency Medicine

## 2015-05-04 ENCOUNTER — Ambulatory Visit
Admission: EM | Admit: 2015-05-04 | Discharge: 2015-05-04 | Disposition: A | Discharge status: Home / Self Care | Payer: Medicare Other | Attending: Family Medicine | Admitting: Family Medicine

## 2015-05-04 ENCOUNTER — Encounter: Payer: Self-pay | Admitting: Emergency Medicine

## 2015-05-04 DIAGNOSIS — R1011 Right upper quadrant pain: Secondary | ICD-10-CM | POA: Insufficient documentation

## 2015-05-04 DIAGNOSIS — Z7901 Long term (current) use of anticoagulants: Secondary | ICD-10-CM | POA: Insufficient documentation

## 2015-05-04 DIAGNOSIS — I252 Old myocardial infarction: Secondary | ICD-10-CM | POA: Insufficient documentation

## 2015-05-04 DIAGNOSIS — M25511 Pain in right shoulder: Secondary | ICD-10-CM | POA: Diagnosis not present

## 2015-05-04 DIAGNOSIS — Z8673 Personal history of transient ischemic attack (TIA), and cerebral infarction without residual deficits: Secondary | ICD-10-CM | POA: Insufficient documentation

## 2015-05-04 DIAGNOSIS — K8050 Calculus of bile duct without cholangitis or cholecystitis without obstruction: Secondary | ICD-10-CM

## 2015-05-04 DIAGNOSIS — R091 Pleurisy: Secondary | ICD-10-CM | POA: Diagnosis present

## 2015-05-04 DIAGNOSIS — K8020 Calculus of gallbladder without cholecystitis without obstruction: Secondary | ICD-10-CM | POA: Insufficient documentation

## 2015-05-04 DIAGNOSIS — H53469 Homonymous bilateral field defects, unspecified side: Secondary | ICD-10-CM

## 2015-05-04 DIAGNOSIS — G8929 Other chronic pain: Secondary | ICD-10-CM | POA: Diagnosis not present

## 2015-05-04 DIAGNOSIS — I69898 Other sequelae of other cerebrovascular disease: Secondary | ICD-10-CM | POA: Diagnosis not present

## 2015-05-04 DIAGNOSIS — I69398 Other sequelae of cerebral infarction: Secondary | ICD-10-CM

## 2015-05-04 LAB — COMPREHENSIVE METABOLIC PANEL
ALT: 26 U/L (ref 14–54)
AST: 28 U/L (ref 15–41)
Albumin: 4.4 g/dL (ref 3.5–5.0)
Alkaline Phosphatase: 128 U/L — ABNORMAL HIGH (ref 38–126)
Anion gap: 12 (ref 5–15)
BUN: 8 mg/dL (ref 6–20)
CO2: 21 mmol/L — ABNORMAL LOW (ref 22–32)
Calcium: 9 mg/dL (ref 8.9–10.3)
Chloride: 108 mmol/L (ref 101–111)
Creatinine, Ser: 0.7 mg/dL (ref 0.44–1.00)
GFR calc Af Amer: >60 mL/min (ref >60)
GFR calc non Af Amer: >60 mL/min (ref >60)
Glucose, Bld: 102 mg/dL — ABNORMAL HIGH (ref 65–99)
Potassium: 3.4 mmol/L — ABNORMAL LOW (ref 3.5–5.1)
Sodium: 141 mmol/L (ref 135–145)
Total Bilirubin: 1.3 mg/dL — ABNORMAL HIGH (ref 0.3–1.2)
Total Protein: 7.4 g/dL (ref 6.5–8.1)

## 2015-05-04 LAB — CBC WITH DIFFERENTIAL/PLATELET
Basophils Absolute: 0 10*3/uL (ref 0–0.1)
Basophils Relative: 0 %
Eosinophils Absolute: 0.1 10*3/uL (ref 0–0.7)
Eosinophils Relative: 1 %
HCT: 40.9 % (ref 35.0–47.0)
Hemoglobin: 13.8 g/dL (ref 12.0–16.0)
Lymphocytes Relative: 28 %
Lymphs Abs: 2 10*3/uL (ref 1.0–3.6)
MCH: 30.4 pg (ref 26.0–34.0)
MCHC: 33.8 g/dL (ref 32.0–36.0)
MCV: 89.8 fL (ref 80.0–100.0)
Monocytes Absolute: 0.5 10*3/uL (ref 0.2–0.9)
Monocytes Relative: 7 %
Neutro Abs: 4.7 10*3/uL (ref 1.4–6.5)
Neutrophils Relative %: 64 %
Platelets: 254 10*3/uL (ref 150–440)
RBC: 4.56 MIL/uL (ref 3.80–5.20)
RDW: 12.6 % (ref 11.5–14.5)
WBC: 7.3 10*3/uL (ref 3.6–11.0)

## 2015-05-04 LAB — URINALYSIS COMPLETE WITH MICROSCOPIC (ARMC ONLY)
Glucose, UA: NEGATIVE mg/dL
Hgb urine dipstick: NEGATIVE
Ketones, ur: NEGATIVE mg/dL
Nitrite: NEGATIVE
Protein, ur: NEGATIVE mg/dL
RBC / HPF: NONE SEEN RBC/hpf (ref <3)
Specific Gravity, Urine: 1.025 (ref 1.005–1.030)
pH: 6.5 (ref 5.0–8.0)

## 2015-05-04 LAB — LIPASE, BLOOD: Lipase: 19 U/L — ABNORMAL LOW (ref 22–51)

## 2015-05-04 MED ORDER — ONDANSETRON 8 MG PO TBDP: 8.0000 mg | ORAL_TABLET | Freq: Two times a day (BID) | ORAL | Status: DC

## 2015-05-04 MED ORDER — HYDROCODONE-ACETAMINOPHEN 5-325 MG PO TABS
1.0000 | ORAL_TABLET | Freq: Four times a day (QID) | ORAL | Status: DC | PRN
Start: 2015-05-04 — End: 2018-10-29

## 2015-05-04 NOTE — ED Notes (Signed)
Pt withj pain in right side of chest annd side x 2 days

## 2015-05-04 NOTE — ED Provider Notes (Signed)
CSN: [id number]     Arrival date & time 05/04/15  0825 History   None    Chief Complaint  Patient presents with  . Pleurisy   (Consider location/radiation/quality/duration/timing/severity/associated sxs/prior Treatment) HPI   This a 34 year old female who is a poor historian resents with a three-day history of right shoulder pain and right flank pain. She states that it is independent of each other and the shoulder pain has been there for a longer period of time and tends to come and go. Her flank pain however is new and is constant. Denies any shortness of breath or chest pain. She does have a history of previous CVA 5 years ago and MI 6 months ago when attempt to place a stent was attempted that evidently failed according to her account. She has chronic pain from her previous CVA managed well with non-opiate medications. She is currently taking warfarin 5 mg daily that she began taking after her heart attack. She has been on aspirin therapy since her CVA. She does not remember doing anything strenuous or any new activity that may account for her shoulder pain. Is not painful to move until she gets into overhead motion which then causes her to have discomfort. Her previous anginal equivalent was a feeling of a brick on her chest which she denies at the present time. She does have a prescription for nitroglycerin that she takes if she has that type of feeling. 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 This is in April 2012. Then she had an embolic event to her right posterior longitudinal branch requiring catheterization and balloon angioplasty but no stent was placed at that time. She was placed on warfarin and her INR is being followed at the clinic over there. She states yesterday her INR was high at 4 and she was told to not take her warfarin for 2 days.  Past Medical History  Diagnosis Date   . Stroke   . MI (myocardial infarction)   . Migraines   . Hypercholesteremia    Past Surgical History  Procedure Laterality Date  . Cardiac surgery     Family History  Problem Relation Age of Onset  . Heart attack Father    Social History  Substance Use Topics  . Smoking status: Never Smoker   . Smokeless tobacco: Never Used  . Alcohol Use: No   OB History    No data available     Review of Systems  Constitutional: Positive for appetite change. Negative for fever, chills and fatigue.  Gastrointestinal: Positive for abdominal pain.  Genitourinary: Positive for flank pain.  All other systems reviewed and are negative.   Allergies  Amoxicillin; Penicillins; Cefdinir; and Augmentin  Home Medications   Prior to Admission medications   Medication Sig Start Date End Date Taking? Authorizing Provider  aspirin 325 MG tablet Take 325 mg by mouth daily.    Historical Provider, MD  atorvastatin (LIPITOR) 40 MG tablet Take 40 mg by mouth daily.    Historical Provider, MD  butalbital-acetaminophen-caffeine (FIORICET, ESGIC) 50-325-40 MG per tablet Take 1 tablet by mouth 2 (two) times daily as needed for headache.    Historical Provider, MD  doxycycline (VIBRAMYCIN) 100 MG capsule Take 1 capsule (100 mg total) by mouth 2 (two) times daily. 12/11/14   Barbaraann Barthel, NP  gabapentin (NEURONTIN) 600 MG tablet Take 600 mg by mouth 3 (three) times daily.  Historical Provider, MD  HYDROcodone-acetaminophen (NORCO/VICODIN) 5-325 MG tablet Take 1-2 tablets by mouth every 6 (six) hours as needed for severe pain. 05/04/15   Lutricia Feil, PA-C  losartan (COZAAR) 25 MG tablet Take 25 mg by mouth daily.    Historical Provider, MD  metoprolol (LOPRESSOR) 50 MG tablet Take 50 mg by mouth daily.    Historical Provider, MD  nitroGLYCERIN (NITROSTAT) 0.4 MG SL tablet Place 0.4 mg under the tongue every 5 (five) minutes as needed for chest pain.    Historical Provider, MD  ondansetron (ZOFRAN  ODT) 8 MG disintegrating tablet Take 1 tablet (8 mg total) by mouth 2 (two) times daily. 05/04/15   Lutricia Feil, PA-C  warfarin (COUMADIN) 5 MG tablet Take 5 mg by mouth daily at 6 PM.    Historical Provider, MD   Meds Ordered and Administered this Visit  Medications - No data to display  BP 116/60 mmHg  Pulse 69  Temp(Src) 98.1 F (36.7 C) (Tympanic)  Resp 20  Ht 5' 2.5" (1.588 m)  Wt 200 lb (90.719 kg)  BMI 35.97 kg/m2  SpO2 100%  LMP 04/20/2015 No data found.   Physical Exam  Constitutional: She is oriented to person, place, and time. She appears well-developed and well-nourished.  Patient was examined with Herbert Seta, RN acting as my chaperone  HENT:  Head: Normocephalic and atraumatic.  Eyes: Pupils are equal, round, and reactive to light.  Neck: Normal range of motion. Neck supple. No tracheal deviation present.  Cardiovascular: Normal rate, regular rhythm and normal heart sounds.  Exam reveals no gallop and no friction rub.   No murmur heard. Pulmonary/Chest: Breath sounds normal. No respiratory distress. She has no wheezes. She has no rales.  Abdominal: Soft. Bowel sounds are normal. She exhibits no distension. There is tenderness. There is no rebound and no guarding.  Generation of the abdomen shows numerous striae hasn't. Bowel sounds are present and normal. There is no rebound or guarding present. Maximum tenderness is in the right upper quadrant subcostal with a mildly positive Murphy's sign. This reproduces her symptoms.  Musculoskeletal:  Examination of the right shoulder shows good range of motion with discomfort at the extremes of abduction and external rotation in abduction. There is some tenderness in the trapezius on the right. The shoulder pain is well-documented in the notes from [facility] for which she has been receiving physical therapy.  Lymphadenopathy:    She has no cervical adenopathy.  Neurological: She is alert and oriented to person, place, and time.   Skin: Skin is warm and dry.  Psychiatric: She has a normal mood and affect. Her behavior is normal. Judgment and thought content normal.  Nursing note and vitals reviewed.   ED Course  Procedures (including critical care time)  Labs Review Labs Reviewed  URINALYSIS COMPLETEWITH MICROSCOPIC ([facility] ONLY) - Abnormal; Notable for the following:    APPearance CLOUDY (*)    Bilirubin Urine 1+ (*)    Leukocytes, UA 1+ (*)    Bacteria, UA MANY (*)    Squamous Epithelial / LPF 6-30 (*)    All other components within normal limits  LIPASE, BLOOD - Abnormal; Notable for the following:    Lipase 19 (*)    All other components within normal limits  COMPREHENSIVE METABOLIC PANEL - Abnormal; Notable for the following:    Potassium 3.4 (*)    CO2 21 (*)    Glucose, Bld 102 (*)    Alkaline Phosphatase 128 (*)  Total Bilirubin 1.3 (*)    All other components within normal limits  URINE CULTURE  CBC WITH DIFFERENTIAL/PLATELET    Imaging Review US Abdomen Limited Ruq  05/04/2015   CLINICAL DATA:  Right upper quadrant pain for 2 days. No known injury. Initial encounter.  EXAM: US ABDOMEN LIMITED - RIGHT UPPER QUADRANT  COMPARISON:  None.  FINDINGS: Gallbladder:  Multiple stones are seen in the gallbladder measuring up to 1.3 cm in diameter. No pericholecystic fluid or wall thickening is identified. Sonographer reports negative Murphy's sign.  Common bile duct:  Diameter: 0.3 cm  Liver:  No focal lesion identified. Within normal limits in parenchymal echogenicity.  IMPRESSION: Gallstones without evidence of cholecystitis.   Electronically Signed   By: Drusilla Kanner M.D.   On: 05/04/2015 14:16     Visual Acuity Review  Right Eye Distance:   Left Eye Distance:   Bilateral Distance:    Right Eye Near:   Left Eye Near:    Bilateral Near:     ED ECG REPORT   Date: 05/04/2015  EKG Time: 2:33 PM  Rate: 67  Rhythm: normal sinus rhythm,  rhythm","unchanged from previous tracings"}   Axis:normal  Intervals ST&T Change:Q waves II III aVF from old inferior infarct. Otherwise no acute changes.  Narrative Interpretation: NSR with evidence of previous inferior wall MI. No acute changes seen.     I have personally reviewed the EKG with the above findings being mine.        MDM   1. Abdominal pain, RUQ (right upper quadrant)   2. RUQ abdominal pain   3. Chronic right shoulder pain   4. CVA, old, homonymous hemianopsia   5. MI, old   72. Biliary colic   7. Gall bladder stones    Plan: 1. Test/x-ray results and diagnosis reviewed with patient. Patient is returning at 1:30 for the 145 ultrasound of her right upper quadrant. Laboratory that I have available time was reviewed in detail with the patient. Her urine was a dirty catch and therefore we will culture. She is asymptomatic from a urinary standpoint and no medications were given at this time. We will await the results of the cultures and sensitivities in that regard. 2. rx as per orders; risks, benefits, potential side effects reviewed with patient 3. Recommend supportive treatment with zofran and vicodin. 4. F/u with primary care at [facility] She will likely need to have a referral to a surgeon for evaluation following. He has worsening pain, nausea vomiting I recommended she go to emergency room immediately.Marland Kitchen She will make an appointment with her primary care tomorrow to notify them of biliary colic findings of nonobstructive gallstones found today on ultrasound a copy of the ultrasound report was given to the patient for her records  Lutricia Feil, PA-C 05/04/15 1433

## 2015-05-04 NOTE — Discharge Instructions (Signed)
Biliary Colic  °Biliary colic is a steady or irregular pain in the upper abdomen. It is usually under the right side of the rib cage. It happens when gallstones interfere with the normal flow of bile from the gallbladder. Bile is a liquid that helps to digest fats. Bile is made in the liver and stored in the gallbladder. When you eat a meal, bile passes from the gallbladder through the cystic duct and the common bile duct into the small intestine. There, it mixes with partially digested food. If a gallstone blocks either of these ducts, the normal flow of bile is blocked. The muscle cells in the bile duct contract forcefully to try to move the stone. This causes the pain of biliary colic.  °SYMPTOMS  °· A person with biliary colic usually complains of pain in the upper abdomen. This pain can be: °¨ In the center of the upper abdomen just below the breastbone. °¨ In the upper-right part of the abdomen, near the gallbladder and liver. °¨ Spread back toward the right shoulder blade. °· Nausea and vomiting. °· The pain usually occurs after eating. °· Biliary colic is usually triggered by the digestive system's demand for bile. The demand for bile is high after fatty meals. Symptoms can also occur when a person who has been fasting suddenly eats a very large meal. Most episodes of biliary colic pass after 1 to 5 hours. After the most intense pain passes, your abdomen may continue to ache mildly for about 24 hours. °DIAGNOSIS  °After you describe your symptoms, your caregiver will perform a physical exam. He or she will pay attention to the upper right portion of your belly (abdomen). This is the area of your liver and gallbladder. An ultrasound will help your caregiver look for gallstones. Specialized scans of the gallbladder may also be done. Blood tests may be done, especially if you have fever or if your pain persists. °PREVENTION  °Biliary colic can be prevented by controlling the risk factors for gallstones. Some of  these risk factors, such as heredity, increasing age, and pregnancy are a normal part of life. Obesity and a high-fat diet are risk factors you can change through a healthy lifestyle. Women going through menopause who take hormone replacement therapy (estrogen) are also more likely to develop biliary colic. °TREATMENT  °· Pain medication may be prescribed. °· You may be encouraged to eat a fat-free diet. °· If the first episode of biliary colic is severe, or episodes of colic keep retuning, surgery to remove the gallbladder (cholecystectomy) is usually recommended. This procedure can be done through small incisions using an instrument called a laparoscope. The procedure often requires a brief stay in the hospital. Some people can leave the hospital the same day. It is the most widely used treatment in people troubled by painful gallstones. It is effective and safe, with no complications in more than 90% of cases. °· If surgery cannot be done, medication that dissolves gallstones may be used. This medication is expensive and can take months or years to work. Only small stones will dissolve. °· Rarely, medication to dissolve gallstones is combined with a procedure called shock-wave lithotripsy. This procedure uses carefully aimed shock waves to break up gallstones. In many people treated with this procedure, gallstones form again within a few years. °PROGNOSIS  °If gallstones block your cystic duct or common bile duct, you are at risk for repeated episodes of biliary colic. There is also a 25% chance that you will develop   a gallbladder infection(acute cholecystitis), or some other complication of gallstones within 10 to 20 years. If you have surgery, schedule it at a time that is convenient for you and at a time when you are not sick. °HOME CARE INSTRUCTIONS  °· Drink plenty of clear fluids. °· Avoid fatty, greasy or fried foods, or any foods that make your pain worse. °· Take medications as directed. °SEEK MEDICAL  CARE IF:  °· You develop a fever over 100.5° F (38.1° C). °· Your pain gets worse over time. °· You develop nausea that prevents you from eating and drinking. °· You develop vomiting. °SEEK IMMEDIATE MEDICAL CARE IF:  °· You have continuous or severe belly (abdominal) pain which is not relieved with medications. °· You develop nausea and vomiting which is not relieved with medications. °· You have symptoms of biliary colic and you suddenly develop a fever and shaking chills. This may signal cholecystitis. Call your caregiver immediately. °· You develop a yellow color to your skin or the white part of your eyes (jaundice). °Document Released: 12/25/2005 Document Revised: 10/16/2011 Document Reviewed: 03/05/2008 °ExitCare® Patient Information ©2015 [employer]. This information is not intended to replace advice given to you by your health care provider. Make sure you discuss any questions you have with your health care provider. ° °

## 2015-05-06 LAB — URINE CULTURE

## 2015-05-06 NOTE — ED Notes (Signed)
Final report of urine C&S negative for infection 

## 2015-05-18 DIAGNOSIS — K8020 Calculus of gallbladder without cholecystitis without obstruction: Secondary | ICD-10-CM | POA: Insufficient documentation

## 2015-05-19 ENCOUNTER — Ambulatory Visit: Payer: Self-pay | Admitting: General Surgery

## 2015-06-28 DIAGNOSIS — O3680X0 Pregnancy with inconclusive fetal viability, not applicable or unspecified: Secondary | ICD-10-CM | POA: Insufficient documentation

## 2015-06-28 DIAGNOSIS — Z86718 Personal history of other venous thrombosis and embolism: Secondary | ICD-10-CM | POA: Insufficient documentation

## 2015-07-22 ENCOUNTER — Encounter: Payer: Self-pay | Admitting: *Deleted

## 2015-08-12 DIAGNOSIS — Z30431 Encounter for routine checking of intrauterine contraceptive device: Secondary | ICD-10-CM | POA: Insufficient documentation

## 2015-08-12 DIAGNOSIS — N83209 Unspecified ovarian cyst, unspecified side: Secondary | ICD-10-CM | POA: Insufficient documentation

## 2015-10-16 ENCOUNTER — Ambulatory Visit
Admission: EM | Admit: 2015-10-16 | Discharge: 2015-10-16 | Disposition: A | Discharge status: Home / Self Care | Payer: Medicare HMO | Attending: Family Medicine | Admitting: Family Medicine

## 2015-10-16 ENCOUNTER — Encounter: Payer: Self-pay | Admitting: Gynecology

## 2015-10-16 DIAGNOSIS — K029 Dental caries, unspecified: Secondary | ICD-10-CM

## 2015-10-16 MED ORDER — HYDROCODONE-ACETAMINOPHEN 5-325 MG PO TABS: 1.0000 | ORAL_TABLET | Freq: Three times a day (TID) | ORAL | Status: DC | PRN

## 2015-10-16 MED ORDER — CLINDAMYCIN HCL 150 MG PO CAPS: 300.0000 mg | ORAL_CAPSULE | Freq: Three times a day (TID) | ORAL | Status: DC

## 2015-10-16 NOTE — ED Notes (Signed)
Patient c/o left side jaw and ear pain. Per patient need dental work on broken tooth on the left side. Per patient pain stated x 3 days ago.

## 2015-10-16 NOTE — Discharge Instructions (Signed)
Dental Caries °Dental caries is tooth decay. This decay can cause a hole in teeth (cavity) that can get bigger and deeper over time. °HOME CARE °· Brush and floss your teeth. Do this at least two times a day. °· Use a fluoride toothpaste. °· Use a mouth rinse if told by your dentist or doctor. °· Eat less sugary and starchy foods. Drink less sugary drinks. °· Avoid snacking often on sugary and starchy foods. Avoid sipping often on sugary drinks. °· Keep regular checkups and cleanings with your dentist. °· Use fluoride supplements if told by your dentist or doctor. °· Allow fluoride to be applied to teeth if told by your dentist or doctor. °  °This information is not intended to replace advice given to you by your health care provider. Make sure you discuss any questions you have with your health care provider. °  °Document Released: 05/02/2008 Document Revised: 08/14/2014 Document Reviewed: 07/26/2012 °Elsevier Interactive Patient Education ©2016 [employer] ° °Dental Pain °Dental pain may be caused by many things, including: °· Tooth decay (cavities or caries). Cavities cause the nerve of your tooth to be open to air and hot or cold temperatures. This can cause pain or discomfort. °· Abscess or infection. A dental abscess is an area that is full of infected pus from a bacterial infection in the inner part of the tooth (pulp). It usually happens at the end of the tooth's root. °· Injury. °· An unknown reason (idiopathic). °Your pain may be mild or severe. It may only happen when: °· You are chewing. °· You are exposed to hot or cold temperature. °· You are eating or drinking sugary foods or beverages, such as: °¨ Soda. °¨ Candy. °Your pain may also be there all of the time. °HOME CARE °Watch your dental pain for any changes. Do these things to lessen your discomfort: °· Take medicines only as told by your dentist. °· If your dentist tells you to take an antibiotic medicine, finish all of it even if you start to  feel better. °· Keep all follow-up visits as told by your dentist. This is important. °· Do not apply heat to the outside of your face. °· Rinse your mouth or gargle with salt water if told by your dentist. This helps with pain and swelling. °¨ You can make salt water by adding ¼ tsp of salt to 1 cup of warm water. °· Apply ice to the painful area of your face: °¨ Put ice in a plastic bag. °¨ Place a towel between your skin and the bag. °¨ Leave the ice on for 20 minutes, 2-3 times per day. °· Avoid foods or drinks that cause you pain, such as: °¨ Very hot or very cold foods or drinks. °¨ Sweet or sugary foods or drinks. °GET HELP IF: °· Your pain is not helped with medicines. °· Your symptoms are worse. °· You have new symptoms. °GET HELP RIGHT AWAY IF: °· You cannot open your mouth. °· You are having trouble breathing or swallowing. °· You have a fever. °· Your face, neck, or jaw is puffy (swollen). °  °This information is not intended to replace advice given to you by your health care provider. Make sure you discuss any questions you have with your health care provider. °  °Document Released: 01/10/2008 Document Revised: 12/08/2014 Document Reviewed: 07/20/2014 °Elsevier Interactive Patient Education ©2016 [employer] ° °

## 2015-10-16 NOTE — ED Provider Notes (Signed)
CSN: [id number]     Arrival date & time 10/16/15  [id number] History   First MD Initiated Contact with Patient 10/16/15 [phone]    Nurses notes were reviewed. Chief Complaint  Patient presents with  . Dental Pain  Patient with dental pain. States that the was teeth in the left lower mouth broke on Wednesday. She been having pain from the tooth earlier soaking this would help things and intermittent. She reports pain is increased causing more discomfort. She reports pregnancy her dentist next week and that they can extract remains the was teeth. She does not think she will need to see an oral Careers advisersurgeon.  History of myocardial infarction and also past history of stroke before in the past. She is use Vicodin for pain as well. And Percocet. Heart attack was about a year ago. She has history of hyper lipidemia and she states she's never smoker family history father has had a heart attack. She looks to all the penicillins and also Omnicef as well.  (Consider location/radiation/quality/duration/timing/severity/associated sxs/prior Treatment) Patient is a 35 y.o. female presenting with tooth pain. The history is provided by the patient. No language interpreter was used.  Dental Pain Location:  Lower Lower teeth location:  17/LL 3rd molar Quality:  Sharp Severity:  Moderate Duration:  3 days Progression:  Worsening Chronicity:  New Context: dental caries, dental fracture and poor dentition   Previous work-up:  Dental exam and filled cavity Relieved by:  Nothing Associated symptoms: congestion, facial pain, facial swelling and gum swelling     Past Medical History  Diagnosis Date  . Stroke (HCC)   . MI (myocardial infarction) (HCC)   . Migraines   . Hypercholesteremia    Past Surgical History  Procedure Laterality Date  . Cardiac surgery     Family History  Problem Relation Age of Onset  . Heart attack Father    Social History  Substance Use Topics  . Smoking status: Never Smoker   .  Smokeless tobacco: Never Used  . Alcohol Use: No   OB History    No data available     Review of Systems  HENT: Positive for congestion, dental problem and facial swelling.   All other systems reviewed and are negative.   Allergies  Amoxicillin; Penicillins; Cefdinir; and Augmentin  Home Medications   Prior to Admission medications   Medication Sig Start Date End Date Taking? Authorizing Provider  levonorgestrel (MIRENA) 20 MCG/24HR IUD 1 each by Intrauterine route once.   Yes Historical Provider, MD  aspirin 325 MG tablet Take 325 mg by mouth daily.    Historical Provider, MD  atorvastatin (LIPITOR) 40 MG tablet Take 40 mg by mouth daily.    Historical Provider, MD  butalbital-acetaminophen-caffeine (FIORICET, ESGIC) 50-325-40 MG per tablet Take 1 tablet by mouth 2 (two) times daily as needed for headache.    Historical Provider, MD  clindamycin (CLEOCIN) 150 MG capsule Take 2 capsules (300 mg total) by mouth 3 (three) times daily. 10/16/15   Hassan RowanEugene Rocket Gunderson, MD  doxycycline (VIBRAMYCIN) 100 MG capsule Take 1 capsule (100 mg total) by mouth 2 (two) times daily. 12/11/14   Barbaraann Barthelina A Betancourt, NP  gabapentin (NEURONTIN) 600 MG tablet Take 600 mg by mouth 3 (three) times daily.    Historical Provider, MD  HYDROcodone-acetaminophen (NORCO) 5-325 MG tablet Take 1 tablet by mouth every 8 (eight) hours as needed for moderate pain. 10/16/15   Hassan RowanEugene Khayri Kargbo, MD  HYDROcodone-acetaminophen (NORCO/VICODIN) 5-325 MG tablet Take 1-2 tablets by  mouth every 6 (six) hours as needed for severe pain. 05/04/15   Lutricia Feil, PA-C  losartan (COZAAR) 25 MG tablet Take 25 mg by mouth daily.    Historical Provider, MD  metoprolol (LOPRESSOR) 50 MG tablet Take 50 mg by mouth daily.    Historical Provider, MD  nitroGLYCERIN (NITROSTAT) 0.4 MG SL tablet Place 0.4 mg under the tongue every 5 (five) minutes as needed for chest pain.    Historical Provider, MD  ondansetron (ZOFRAN ODT) 8 MG disintegrating tablet Take 1  tablet (8 mg total) by mouth 2 (two) times daily. 05/04/15   Lutricia Feil, PA-C  warfarin (COUMADIN) 5 MG tablet Take 5 mg by mouth daily at 6 PM.    Historical Provider, MD   Meds Ordered and Administered this Visit  Medications - No data to display  BP 118/60 mmHg  Pulse 73  Temp(Src) 98.2 F (36.8 C) (Oral)  Resp 18  Ht  (1.676 m)  Wt 186 lb (84.369 kg)  BMI 30.04 kg/m2  SpO2 97% No data found.   Physical Exam  Constitutional: She is oriented to person, place, and time. She appears well-developed and well-nourished. No distress.  HENT:  Head: Normocephalic.  Right Ear: Hearing, tympanic membrane, external ear and ear canal normal.  Left Ear: Hearing, tympanic membrane, external ear and ear canal normal.  Mouth/Throat: Dental caries present. No dental abscesses. No oropharyngeal exudate.    Eyes: Conjunctivae are normal. Pupils are equal, round, and reactive to light.  Musculoskeletal: Normal range of motion.  Neurological: She is alert and oriented to person, place, and time.  Skin: Skin is warm and dry.  Vitals reviewed.   ED Course  Procedures (including critical care time)  Labs Review Labs Reviewed - No data to display  Imaging Review No results found.   Visual Acuity Review  Right Eye Distance:   Left Eye Distance:   Bilateral Distance:    Right Eye Near:   Left Eye Near:    Bilateral Near:         MDM   1. Dental caries extending into pulp   2. Pain due to dental caries    Recommend follow-up with dentist or oral surgeon to raise having molar tooth extracted. Because of her allergies penicillin place on Cleocin 100 mg 2 capsules 3 times a day for the next 78 days. Will give prescription for Vicodin for pain.  Note: This dictation was prepared with Dragon dictation along with smaller phrase technology. Any transcriptional errors that result from this process are unintentional.    Hassan Rowan, MD 10/16/15 1041

## 2015-11-17 DIAGNOSIS — I252 Old myocardial infarction: Secondary | ICD-10-CM | POA: Insufficient documentation

## 2017-09-27 IMAGING — US US ABDOMEN LIMITED
1 series · 14 of 25 positions shown · non-contrast
Comparison: None.

CLINICAL DATA: Right upper quadrant pain for 2 days. No known
injury. Initial encounter.

EXAM:
US ABDOMEN LIMITED - RIGHT UPPER QUADRANT

[Series 1: us abdomen limited · 0.25mm/px · 14 of 98 slices shown]
[im 1/98]
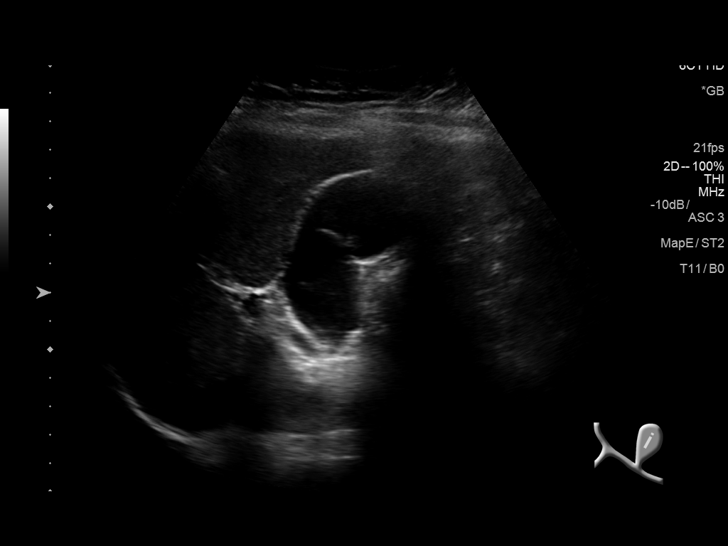
[im 9/98]
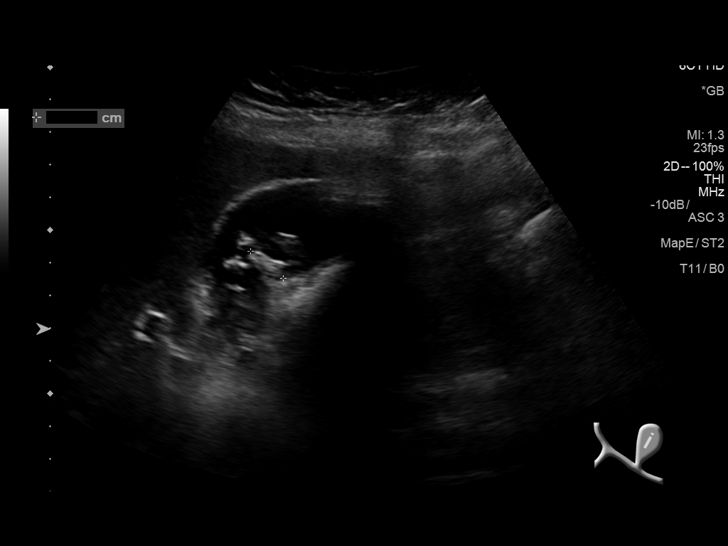
[im 17/98]
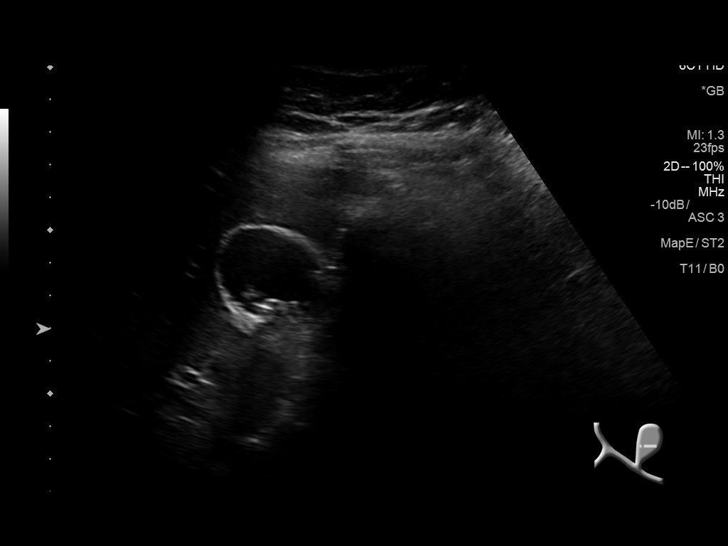
[im 25/98]
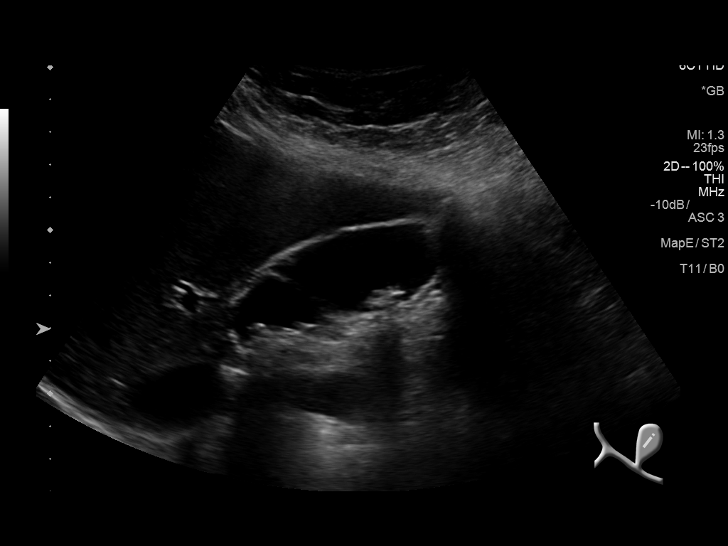
[im 33/98]
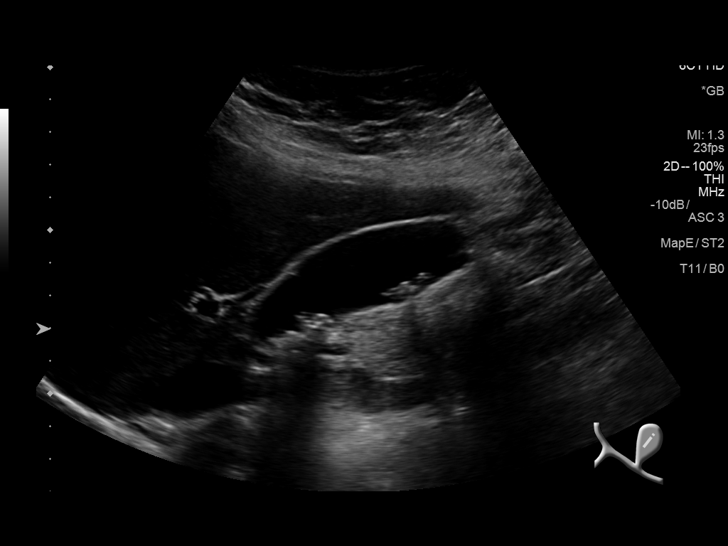
[im 37/98]
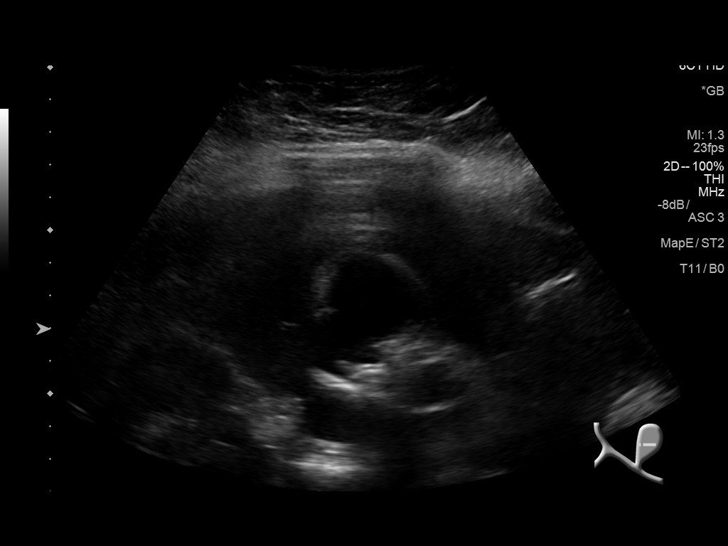
[im 45/98]
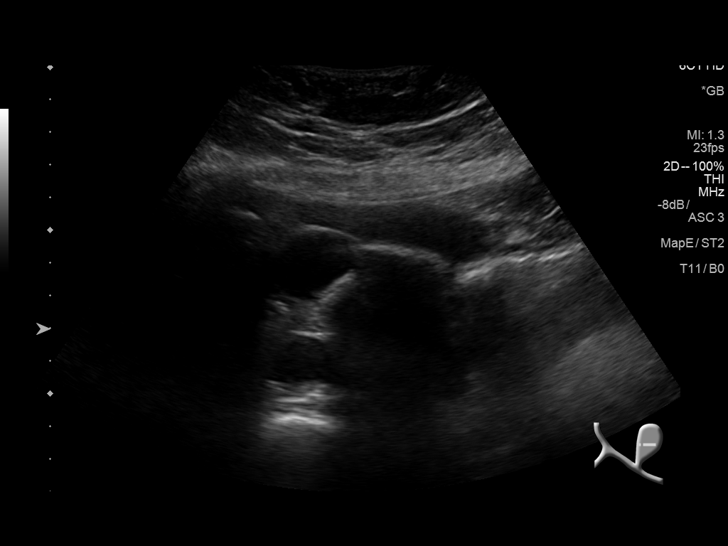
[im 53/98]
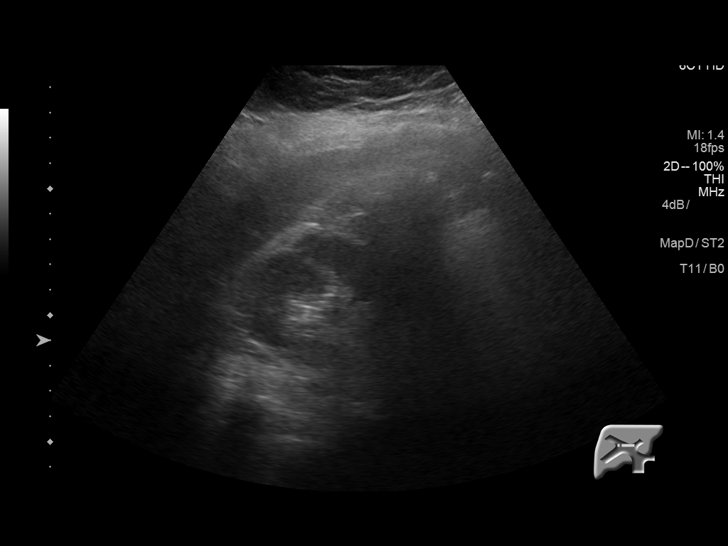
[im 61/98]
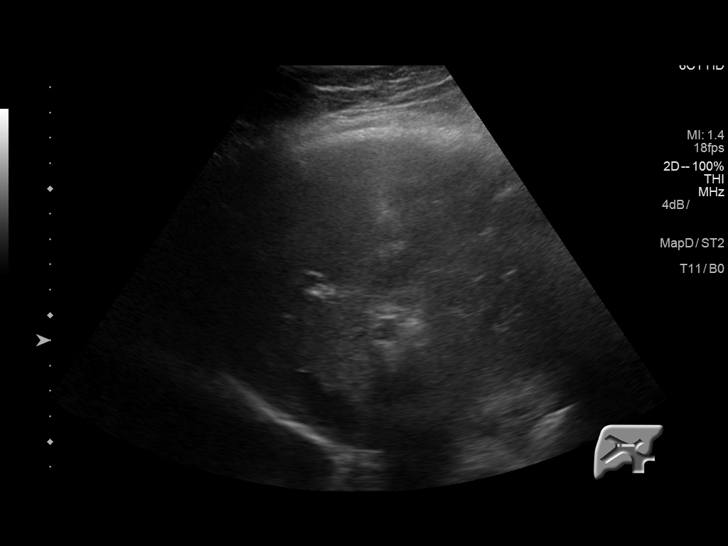
[im 65/98]
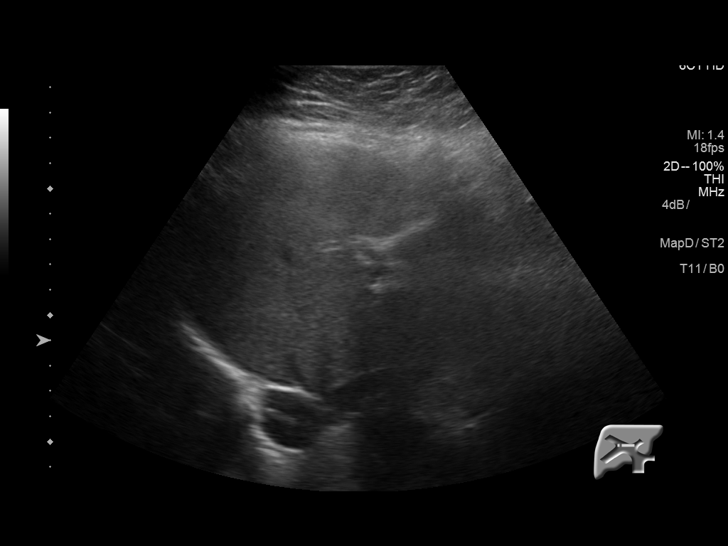
[im 73/98]
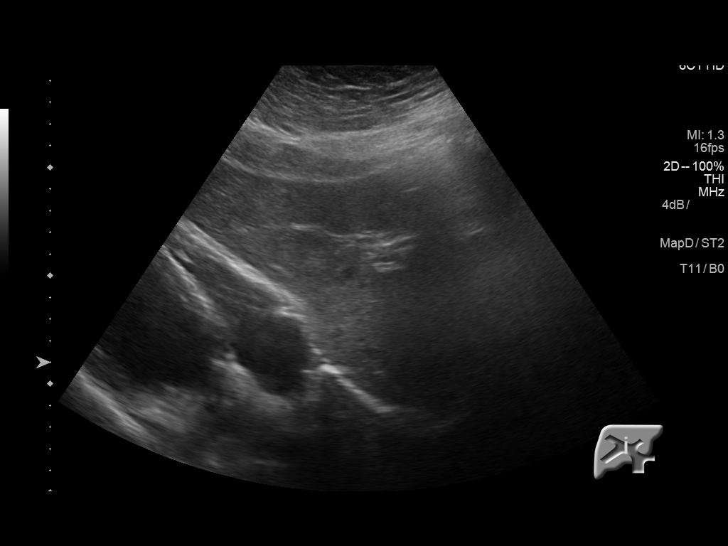
[im 81/98]
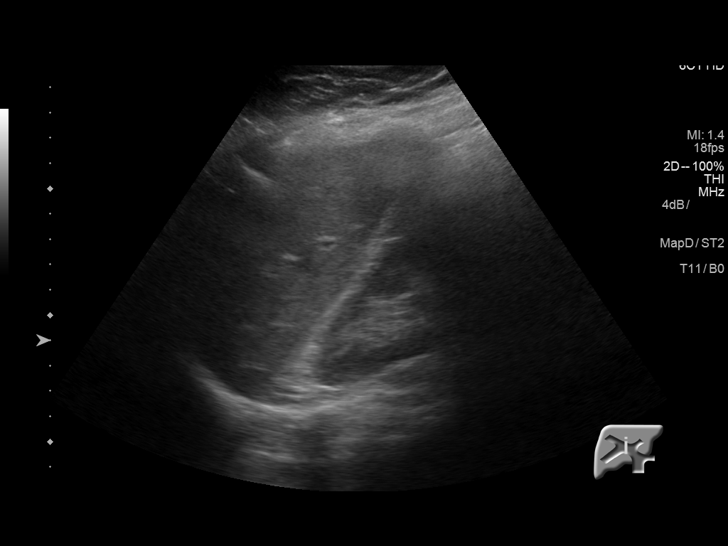
[im 89/98]
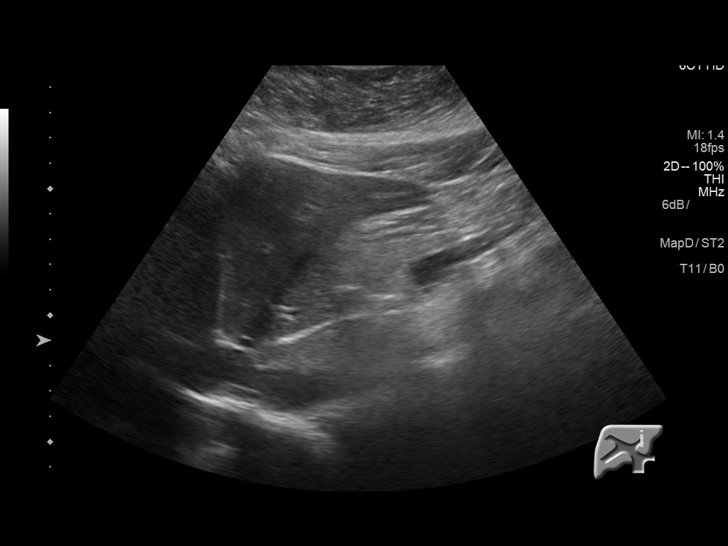
[im 98/98]
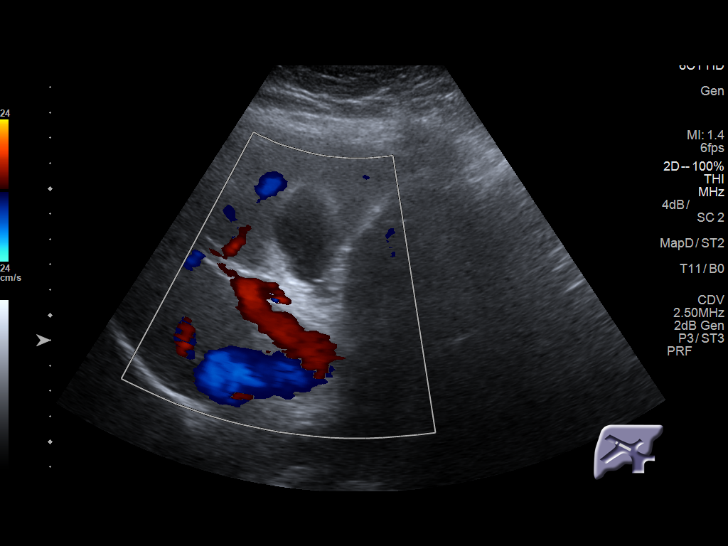

[14 of 25 positions shown; findings below may reference images not displayed]

FINDINGS: Gallbladder:

Multiple stones are seen in the gallbladder measuring up to 1.3 cm
in diameter. No pericholecystic fluid or wall thickening is
identified. Sonographer reports negative Murphy's sign.

Common bile duct:

Diameter: 0.3 cm

Liver:

No focal lesion identified. Within normal limits in parenchymal
echogenicity.
IMPRESSION: Gallstones without evidence of cholecystitis.

## 2018-04-02 ENCOUNTER — Emergency Department (HOSPITAL_COMMUNITY): Payer: Medicare HMO

## 2018-04-02 ENCOUNTER — Encounter (HOSPITAL_COMMUNITY): Payer: Self-pay | Admitting: *Deleted

## 2018-04-02 ENCOUNTER — Emergency Department (HOSPITAL_COMMUNITY)
Admission: EM | Admit: 2018-04-02 | Discharge: 2018-04-02 | Disposition: A | Discharge status: Home / Self Care | Payer: Medicare HMO | Attending: Emergency Medicine | Admitting: Emergency Medicine

## 2018-04-02 DIAGNOSIS — I252 Old myocardial infarction: Secondary | ICD-10-CM | POA: Diagnosis not present

## 2018-04-02 DIAGNOSIS — H66001 Acute suppurative otitis media without spontaneous rupture of ear drum, right ear: Secondary | ICD-10-CM | POA: Diagnosis not present

## 2018-04-02 DIAGNOSIS — Z7982 Long term (current) use of aspirin: Secondary | ICD-10-CM | POA: Diagnosis not present

## 2018-04-02 DIAGNOSIS — Z79899 Other long term (current) drug therapy: Secondary | ICD-10-CM | POA: Diagnosis not present

## 2018-04-02 DIAGNOSIS — Z8673 Personal history of transient ischemic attack (TIA), and cerebral infarction without residual deficits: Secondary | ICD-10-CM | POA: Diagnosis not present

## 2018-04-02 DIAGNOSIS — R0981 Nasal congestion: Secondary | ICD-10-CM | POA: Diagnosis present

## 2018-04-02 LAB — PREGNANCY, URINE: Preg Test, Ur: NEGATIVE

## 2018-04-02 MED ORDER — PROBIOTIC 250 MG PO CAPS: 1.0000 | ORAL_CAPSULE | Freq: Every day | ORAL | 0 refills | Status: DC

## 2018-04-02 MED ORDER — CLINDAMYCIN HCL 150 MG PO CAPS: 450.0000 mg | ORAL_CAPSULE | Freq: Three times a day (TID) | ORAL | 0 refills | Status: DC

## 2018-04-02 MED ORDER — ALBUTEROL SULFATE HFA 108 (90 BASE) MCG/ACT IN AERS
1.0000 | INHALATION_SPRAY | Freq: Once | RESPIRATORY_TRACT | Status: AC
  Administered 2018-04-02: 1 via RESPIRATORY_TRACT
  Filled 2018-04-02: qty 6.7

## 2018-04-02 MED ORDER — FLUTICASONE PROPIONATE 50 MCG/ACT NA SUSP: 1.0000 | Freq: Every day | NASAL | 0 refills | Status: AC

## 2018-04-02 MED ORDER — BENZONATATE 100 MG PO CAPS: 100.0000 mg | ORAL_CAPSULE | Freq: Three times a day (TID) | ORAL | 0 refills | Status: DC

## 2018-04-02 NOTE — ED Provider Notes (Signed)
[facility]GENCY [facility]PART[facility] Provider Note   CSN: [id number] Arrival date & time: 04/02/18  1510     History   Chief Complaint Chief Complaint  Patient presents with  . URI    HPI Joy Adams is a 37 y.o. female with history of stroke, MI, migraines who presents with a few day history of nasal congestion, cough, right ear pain.  She denies any fevers or chills.  She reports that she feels like she has been wheezing.  She denies any chest pain or shortness of breath.  Her cough is nonproductive.  She has not taken any medications at home for symptoms, although she states she saw her doctor who gave her some type of shot.  She does not feel that it is working.  She denies any abdominal pain, nausea, vomiting.  HPI  Past Medical History:  Diagnosis Date  . Hypercholesteremia   . MI (myocardial infarction) (HCC)   . Migraines   . Stroke [facility](HCC)     There are no active problems to display for this patient.   Past Surgical History:  Procedure Laterality Date  . CARDIAC SURGERY       OB History   None      Home Medications    Prior to Admission medications   Medication Sig Start Date End Date Taking? Authorizing Provider  aspirin 325 MG tablet Take 325 mg by mouth daily.    [provider]  atorvastatin (LIPITOR) 40 MG tablet Take 40 mg by mouth daily.    [provider]  benzonatate (TESSALON) 100 MG capsule Take 1 capsule (100 mg total) by mouth every 8 (eight) hours. 04/02/18   Elvia Aydin, Waylan BogaAlexandra M, PA-C  butalbital-acetaminophen-caffeine (FIORICET, ESGIC) 50-325-40 MG per tablet Take 1 tablet by mouth 2 (two) times daily as needed for headache.    [provider]  clindamycin (CLEOCIN) 150 MG capsule Take 3 capsules (450 mg total) by mouth 3 (three) times daily. 04/02/18   Lasonya Hubner, Waylan BogaAlexandra M, PA-C  doxycycline (VIBRAMYCIN) 100 MG capsule Take 1 capsule (100 mg total) by mouth 2 (two) times daily. 12/11/14   Betancourt, Jarold Songina A, NP  fluticasone  (FLONASE) 50 MCG/ACT nasal spray Place 1 spray into both nostrils daily. 04/02/18   Kilian Schwartz, Waylan BogaAlexandra M, PA-C  gabapentin (NEURONTIN) 600 MG tablet Take 600 mg by mouth 3 (three) times daily.    [provider]  HYDROcodone-acetaminophen (NORCO) 5-325 MG tablet Take 1 tablet by mouth every 8 (eight) hours as needed for moderate pain. 10/16/15   Hassan RowanWade, Eugene, MD  HYDROcodone-acetaminophen (NORCO/VICODIN) 5-325 MG tablet Take 1-2 tablets by mouth every 6 (six) hours as needed for severe pain. 05/04/15   Lutricia Feiloemer, William P, PA-C  levonorgestrel (MIRENA) 20 MCG/24HR IUD 1 each by Intrauterine route once.    [provider]  losartan (COZAAR) 25 MG tablet Take 25 mg by mouth daily.    [provider]  metoprolol (LOPRESSOR) 50 MG tablet Take 50 mg by mouth daily.    [provider]  nitroGLYCERIN (NITROSTAT) 0.4 MG SL tablet Place 0.4 mg under the tongue every 5 (five) minutes as needed for chest pain.    [provider]  ondansetron (ZOFRAN ODT) 8 MG disintegrating tablet Take 1 tablet (8 mg total) by mouth 2 (two) times daily. 05/04/15   Lutricia Feiloemer, William P, PA-C  Saccharomyces boulardii (PROBIOTIC) 250 MG CAPS Take 1 capsule by mouth daily. 04/02/18   Emi HolesLaw, Keiji Melland M, PA-C  warfarin (COUMADIN) 5 MG  tablet Take 5 mg by mouth daily at 6 PM.    [provider]    Family History Family History  Problem Relation Age of Onset  . Heart attack Father     Social History Social History   Tobacco Use  . Smoking status: Never Smoker  . Smokeless tobacco: Never Used  Substance Use Topics  . Alcohol use: No  . Drug use: No     Allergies   Amoxicillin; Penicillins; Cefdinir; and Augmentin [amoxicillin-pot clavulanate]   Review of Systems Review of Systems  Constitutional: Negative for chills and fever.  HENT: Positive for congestion, ear pain and sore throat.   Respiratory: Positive for cough and wheezing. Negative for shortness of breath.     Cardiovascular: Negative for chest pain.  Gastrointestinal: Negative for abdominal pain, nausea and vomiting.     Physical Exam Updated Vital Signs BP 110/75 (BP Location: Right Arm)   Pulse 69   Temp 97.7 F (36.5 C) (Oral)   Resp 16   Ht 5\' 4"  (1.626 m)   Wt 94.3 kg   SpO2 99%   BMI 35.70 kg/m   Physical Exam  Constitutional: She appears well-developed and well-nourished. No distress.  HENT:  Head: Normocephalic and atraumatic.  Right Ear: Tympanic membrane is injected. A middle ear effusion is present.  Left Ear: Tympanic membrane normal.  Mouth/Throat: Oropharynx is clear and moist. No oropharyngeal exudate, posterior oropharyngeal edema, posterior oropharyngeal erythema or tonsillar abscesses. No tonsillar exudate.  Eyes: Pupils are equal, round, and reactive to light. Conjunctivae are normal. Right eye exhibits no discharge. Left eye exhibits no discharge. No scleral icterus.  Neck: Normal range of motion. Neck supple. No thyromegaly present.  Cardiovascular: Normal rate, regular rhythm, normal heart sounds and intact distal pulses. Exam reveals no gallop and no friction rub.  No murmur heard. Pulmonary/Chest: Effort normal and breath sounds normal. No stridor. No respiratory distress. She has no wheezes. She has no rales.  Abdominal: Soft. Bowel sounds are normal. She exhibits no distension. There is no tenderness. There is no rebound and no guarding.  Musculoskeletal: She exhibits no edema.  Lymphadenopathy:    She has no cervical adenopathy.  Neurological: She is alert. Coordination normal.  Skin: Skin is warm and dry. No rash noted. She is not diaphoretic. No pallor.  Psychiatric: She has a normal mood and affect.  Nursing note and vitals reviewed.    ED Treatments / Results  Labs (all labs ordered are listed, but only abnormal results are displayed) Labs Reviewed  PREGNANCY, URINE    EKG None  Radiology Dg Chest 2 View  Result Date:  04/02/2018 CLINICAL DATA:  Cough EXAM: CHEST - 2 VIEW COMPARISON:  Chest CT 12/02/2010 FINDINGS: The heart size and mediastinal contours are within normal limits. Both lungs are clear. The visualized skeletal structures are unremarkable. IMPRESSION: Clear lungs. Electronically Signed   By: Deatra Robinson M.D.   On: 04/02/2018 15:54    Procedures Procedures (including critical care time)  Medications Ordered in ED Medications  albuterol (PROVENTIL HFA;VENTOLIN HFA) 108 (90 Base) MCG/ACT inhaler 1 puff (has no administration in time range)     Initial Impression / Assessment and Plan / ED Course  I have reviewed the triage vital signs and the nursing notes.  Pertinent labs & imaging results that were available during my care of the patient were reviewed by me and considered in my medical decision making (see chart for details).     Patient presenting  with URI symptoms.  Right ear has injection and fluid effusion with pain.  Will treat otitis media with clindamycin considering severe penicillin allergy and patient taking warfarin and azithromycin and compatible with warfarin.  Probiotic will be given in conjunction with clindamycin.  Patient will also be given symptomatic treatment including Tessalon, Flonase, and albuterol inhaler as needed for patient's reported wheezing, although not her on exam.  Chest x-ray is negative.  Follow-up to PCP for recheck.  Patient understands and agrees with plan.  Patient vitals stable.  Course and discharged in satisfactory condition.  Final Clinical Impressions(s) / ED Diagnoses   Final diagnoses:  Acute suppurative otitis media of right ear without spontaneous rupture of tympanic membrane, recurrence not specified    ED Discharge Orders         Ordered    benzonatate (TESSALON) 100 MG capsule  Every 8 hours     04/02/18 1636    clindamycin (CLEOCIN) 150 MG capsule  3 times daily     04/02/18 1636    Saccharomyces boulardii (PROBIOTIC) 250 MG CAPS   Daily     04/02/18 1636    fluticasone (FLONASE) 50 MCG/ACT nasal spray  Daily     04/02/18 1636           Emi Holes, PA-C 04/02/18 1653    Raeford Razor, MD 04/04/18 1324

## 2018-04-02 NOTE — ED Triage Notes (Signed)
Pt with cold symptoms-stuffy nose, cough- np, ear pain.  Pt unsure if she is pregnant.

## 2018-04-02 NOTE — Discharge Instructions (Signed)
Take clindamycin as prescribed until completed for your suspected ear infection.  Take a probiotic to help prevent stomach upset with this medication.  Use Flonase once daily to help with nasal congestion.  Take Tessalon every 8 hours as needed for cough.  Use albuterol inhaler every 4-6 hours as needed for shortness of breath or wheezing.  Please follow-up with your doctor if your symptoms are not improving by early next week.  Please return the emergency department if you develop any new or worsening symptoms including persistent fever over 100.4, chest pain, shortness of breath, or any other concerning symptom.

## 2018-10-18 ENCOUNTER — Ambulatory Visit
Admission: RE | Admit: 2018-10-18 | Discharge: 2018-10-18 | Disposition: A | Discharge status: Home / Self Care | Payer: Medicare HMO | Source: Ambulatory Visit | Attending: Family Medicine | Admitting: Family Medicine

## 2018-10-18 ENCOUNTER — Other Ambulatory Visit: Payer: Self-pay | Admitting: Family Medicine

## 2018-10-18 ENCOUNTER — Ambulatory Visit
Admission: RE | Admit: 2018-10-18 | Discharge: 2018-10-18 | Disposition: A | Discharge status: Home / Self Care | Payer: Medicare HMO | Attending: Family Medicine | Admitting: Family Medicine

## 2018-10-18 ENCOUNTER — Other Ambulatory Visit: Payer: Self-pay

## 2018-10-18 DIAGNOSIS — R05 Cough: Secondary | ICD-10-CM

## 2018-10-18 DIAGNOSIS — R059 Cough, unspecified: Secondary | ICD-10-CM

## 2018-10-29 ENCOUNTER — Ambulatory Visit
Admission: EM | Admit: 2018-10-29 | Discharge: 2018-10-29 | Disposition: A | Discharge status: Home / Self Care | Payer: Medicare HMO | Attending: Family Medicine | Admitting: Family Medicine

## 2018-10-29 ENCOUNTER — Other Ambulatory Visit: Payer: Self-pay

## 2018-10-29 ENCOUNTER — Encounter: Payer: Self-pay | Admitting: Emergency Medicine

## 2018-10-29 DIAGNOSIS — Z8709 Personal history of other diseases of the respiratory system: Secondary | ICD-10-CM

## 2018-10-29 DIAGNOSIS — R0982 Postnasal drip: Secondary | ICD-10-CM | POA: Diagnosis not present

## 2018-10-29 DIAGNOSIS — R0981 Nasal congestion: Secondary | ICD-10-CM

## 2018-10-29 DIAGNOSIS — J209 Acute bronchitis, unspecified: Secondary | ICD-10-CM

## 2018-10-29 DIAGNOSIS — R05 Cough: Secondary | ICD-10-CM

## 2018-10-29 DIAGNOSIS — J0100 Acute maxillary sinusitis, unspecified: Secondary | ICD-10-CM

## 2018-10-29 MED ORDER — DOXYCYCLINE HYCLATE 100 MG PO CAPS: 100.0000 mg | ORAL_CAPSULE | Freq: Two times a day (BID) | ORAL | 0 refills | Status: DC

## 2018-10-29 MED ORDER — BENZONATATE 100 MG PO CAPS: 100.0000 mg | ORAL_CAPSULE | Freq: Three times a day (TID) | ORAL | 0 refills | Status: DC | PRN

## 2018-10-29 MED ORDER — HYDROCOD POLST-CPM POLST ER 10-8 MG/5ML PO SUER: 5.0000 mL | Freq: Every evening | ORAL | 0 refills | Status: DC | PRN

## 2018-10-29 NOTE — Discharge Instructions (Addendum)
Take medication as prescribed. Rest. Drink plenty of fluids.  As discussed, follow-up for INR testing this week.  Follow up with your primary care physician this week. Return to Urgent care for new or worsening concerns.

## 2018-10-29 NOTE — ED Triage Notes (Signed)
Patient c/o cough and congestion. She was diagnosed with the flu 2 weeks ago and she states she is not any better. Denies fever.

## 2018-10-29 NOTE — ED Provider Notes (Signed)
[facility] ____________________________________________  Time seen: Approximately 11:12 AM  I have reviewed the triage vital signs and the nursing notes.   HISTORY  Chief Complaint Cough   HPI Joy Adams is a 38 y.o. female presenting for evaluation of continued nasal congestion, postnasal drainage and cough.  Patient reports 2 weeks ago she was diagnosed with influenza A after it had been going through her household.  States she was treated with Tamiflu, and states that she did feel somewhat better, but reports the cough and congestion has never resolved. Reports sinus pressure. States cough does disrupt her sleep with postnasal drainage.  Denies any recent fevers, states had fever at initial sickness onset.  Denies current sore throat.  States getting a lot of thick nasal drainage out with blowing her nose, and occasional productive cough.  Denies hemoptysis.  Denies chest pain, shortness of breath, extremity edema.  Has continued remain active.  States feels like similar to previous bronchitis.  Has not been taking any over-the-counter medications as she was not sure what she could take.  Overall continues eat and drink well.  Denies recent antibiotic use.  On Coumadin, has her next INR check next week.  Dortha Kern, MD: PCP   Past Medical History:  Diagnosis Date  . Hypercholesteremia   . MI (myocardial infarction) (HCC)   . Migraines   . Stroke [facility])     There are no active problems to display for this patient.   Past Surgical History:  Procedure Laterality Date  . CARDIAC SURGERY       No current facility-administered medications for this encounter.   Current Outpatient Medications:  .  aspirin 325 MG tablet, Take 325 mg by mouth daily., Disp: , Rfl:  .  atorvastatin (LIPITOR) 40 MG tablet, Take 40 mg by mouth daily., Disp: , Rfl:  .  butalbital-acetaminophen-caffeine (FIORICET, ESGIC) 50-325-40 MG per tablet, Take 1 tablet by mouth 2 (two) times  daily as needed for headache., Disp: , Rfl:  .  fluticasone (FLONASE) 50 MCG/ACT nasal spray, Place 1 spray into both nostrils daily., Disp: 1 g, Rfl: 0 .  gabapentin (NEURONTIN) 600 MG tablet, Take 600 mg by mouth 3 (three) times daily., Disp: , Rfl:  .  levonorgestrel (MIRENA) 20 MCG/24HR IUD, 1 each by Intrauterine route once., Disp: , Rfl:  .  losartan (COZAAR) 25 MG tablet, Take 25 mg by mouth daily., Disp: , Rfl:  .  metoprolol (LOPRESSOR) 50 MG tablet, Take 50 mg by mouth daily., Disp: , Rfl:  .  nitroGLYCERIN (NITROSTAT) 0.4 MG SL tablet, Place 0.4 mg under the tongue every 5 (five) minutes as needed for chest pain., Disp: , Rfl:  .  warfarin (COUMADIN) 5 MG tablet, Take 5 mg by mouth daily at 6 PM., Disp: , Rfl:  .  benzonatate (TESSALON PERLES) 100 MG capsule, Take 1 capsule (100 mg total) by mouth 3 (three) times daily as needed for cough., Disp: 15 capsule, Rfl: 0 .  chlorpheniramine-HYDROcodone (TUSSIONEX PENNKINETIC ER) 10-8 MG/5ML SUER, Take 5 mLs by mouth at bedtime as needed for cough. do not drive or operate machinery while taking as can cause drowsiness., Disp: 50 mL, Rfl: 0 .  doxycycline (VIBRAMYCIN) 100 MG capsule, Take 1 capsule (100 mg total) by mouth 2 (two) times daily., Disp: 20 capsule, Rfl: 0  Allergies Amoxicillin; Penicillins; Cefdinir; and Augmentin [amoxicillin-pot clavulanate]  Family History  Problem Relation Age of Onset  . Heart attack Father  Social History Social History   Tobacco Use  . Smoking status: Never Smoker  . Smokeless tobacco: Never Used  Substance Use Topics  . Alcohol use: No  . Drug use: No    Review of Systems Constitutional: No fever ENT: No sore throat. Nasal congestion. Cardiovascular: Denies chest pain. Respiratory: Denies shortness of breath. Gastrointestinal: No abdominal pain.  No nausea, no vomiting.  No diarrhea.   Skin: Negative for rash.   ____________________________________________   PHYSICAL EXAM:   VITAL SIGNS: ED Triage Vitals  Enc Vitals Group     BP 10/29/18 1005 102/62     Pulse Rate 10/29/18 1005 67     Resp 10/29/18 1005 18     Temp 10/29/18 1005 98.2 F (36.8 C)     Temp Source 10/29/18 1005 Oral     SpO2 10/29/18 1005 98 %     Weight 10/29/18 1002 210 lb (95.3 kg)     Height --      Head Circumference --      Peak Flow --      Pain Score 10/29/18 1002 0     Pain Loc --      Pain Edu? --      Excl. in GC? --     Constitutional: Alert and oriented. Well appearing and in no acute distress. Eyes: Conjunctivae are normal.  Head: Atraumatic.Mild to moderate tenderness to palpation bilateral maxillary sinuses. No frontal sinus tenderness. No swelling. No erythema.   Ears: no erythema, normal TMs bilaterally.   Nose: nasal congestion with bilateral nasal turbinate erythema and edema.   Mouth/Throat: Mucous membranes are moist.  Oropharynx non-erythematous.No tonsillar swelling or exudate.  Neck: No stridor.  No cervical spine tenderness to palpation. Hematological/Lymphatic/Immunilogical: No cervical lymphadenopathy. Cardiovascular: Normal rate, regular rhythm. Grossly normal heart sounds.  Good peripheral circulation. Respiratory: Normal respiratory effort.  No retractions. No wheezes. Mild scattered rhonchi.  Good air movement.  Speaks in complete sentences. Musculoskeletal: Ambulatory with steady gait.  Bilateral lower extremities no edema noted. Neurologic:  Normal speech and language. No gait instability. Skin:  Skin is warm, dry and intact. No rash noted. Psychiatric: Flat affect. ___________________________________________   LABS (all labs ordered are listed, but only abnormal results are displayed)  Labs Reviewed - No data to display   PROCEDURES   INITIAL IMPRESSION / ASSESSMENT AND PLAN / ED COURSE  Pertinent labs & imaging results that were available during my care of the patient were reviewed by me and considered in my medical decision making (see  chart for details).  Well-appearing patient.  No acute distress.  Patient diagnosed with influenza A 2 weeks ago, with continued nasal congestion and cough.  Mild scattered rhonchi, otherwise lungs clear throughout.  Suspect sinusitis and bronchitis.  History multiple comorbidities.  Will treat with oral doxycycline, allergic to penicillin.  Patient on Coumadin, monitor for bleeding and follow-up for INR check this week.  PRN Tessalon Perles and Tussionex as needed.  Supportive care.  Follow-up with primary care this week.Discussed indication, risks and benefits of medications with patient.  Discussed follow up and return parameters including no resolution or any worsening concerns. Patient verbalized understanding and agreed to plan.   ____________________________________________   FINAL CLINICAL IMPRESSION(S) / ED DIAGNOSES  Final diagnoses:  Acute bronchitis, unspecified organism  Acute maxillary sinusitis, recurrence not specified     ED Discharge Orders         Ordered    chlorpheniramine-HYDROcodone (TUSSIONEX PENNKINETIC ER) 10-8 MG/5ML SUER  At bedtime PRN     10/29/18 1048    doxycycline (VIBRAMYCIN) 100 MG capsule  2 times daily     10/29/18 1048    benzonatate (TESSALON PERLES) 100 MG capsule  3 times daily PRN     10/29/18 1048           Note: This dictation was prepared with Dragon dictation along with smaller phrase technology. Any transcriptional errors that result from this process are unintentional.         Renford Dills, NP 10/29/18 1132

## 2019-03-31 ENCOUNTER — Ambulatory Visit
Admission: EM | Admit: 2019-03-31 | Discharge: 2019-03-31 | Disposition: A | Discharge status: Home / Self Care | Payer: Medicare HMO | Attending: Family Medicine | Admitting: Family Medicine

## 2019-03-31 DIAGNOSIS — R1031 Right lower quadrant pain: Secondary | ICD-10-CM | POA: Diagnosis not present

## 2019-03-31 MED ORDER — ONDANSETRON 8 MG PO TBDP
8.0000 mg | ORAL_TABLET | Freq: Once | ORAL | Status: AC
  Administered 2019-03-31: 8 mg via ORAL

## 2019-03-31 NOTE — ED Notes (Signed)
Pt in treatment room.  In NAD.  Needs address.  Pt/Fam updated on POC.   

## 2019-03-31 NOTE — ED Provider Notes (Signed)
[facility] ____________________________________________  Time seen: Approximately 2:16 PM  I have reviewed the triage vital signs and the nursing notes.   HISTORY  Chief Complaint Abdominal Pain   HPI Joy Adams is a 38 y.o. female presenting for evaluation of right lower abdominal pain present for the last 4 days.  States pain is constant but intermittent sharp pain on top of the constant based pain.  Does not seem to be affected by movement, urination or bowel.  Last bowel movement yesterday described as normal.  Some nausea, no vomiting.  Also reports the last day or 2 she has noticed some left hip pain with movement.  No injury.  States this feels like a similar pain that she had with appendicitis that was treated with antibiotics.  Only abdominal surgery was a previous cholecystectomy.  No recent cough, fevers, chest pain, shortness of breath.  Denies other aggravating or alleviating factors.  Lynnell Jude, MD: PCP  Past Medical History:  Diagnosis Date  . Hypercholesteremia   . MI (myocardial infarction) ([place])   . Migraines   . Stroke [facility])     There are no active problems to display for this patient.   Past Surgical History:  Procedure Laterality Date  . CARDIAC SURGERY       No current facility-administered medications for this encounter.   Current Outpatient Medications:  .  aspirin 325 MG tablet, Take 325 mg by mouth daily., Disp: , Rfl:  .  atorvastatin (LIPITOR) 40 MG tablet, Take 40 mg by mouth daily., Disp: , Rfl:  .  benzonatate (TESSALON PERLES) 100 MG capsule, Take 1 capsule (100 mg total) by mouth 3 (three) times daily as needed for cough., Disp: 15 capsule, Rfl: 0 .  butalbital-acetaminophen-caffeine (FIORICET, ESGIC) 50-325-40 MG per tablet, Take 1 tablet by mouth 2 (two) times daily as needed for headache., Disp: , Rfl:  .  chlorpheniramine-HYDROcodone (TUSSIONEX PENNKINETIC ER) 10-8 MG/5ML SUER, Take 5 mLs by mouth at bedtime as  needed for cough. do not drive or operate machinery while taking as can cause drowsiness., Disp: 50 mL, Rfl: 0 .  doxycycline (VIBRAMYCIN) 100 MG capsule, Take 1 capsule (100 mg total) by mouth 2 (two) times daily., Disp: 20 capsule, Rfl: 0 .  fluticasone (FLONASE) 50 MCG/ACT nasal spray, Place 1 spray into both nostrils daily., Disp: 1 g, Rfl: 0 .  gabapentin (NEURONTIN) 600 MG tablet, Take 600 mg by mouth 3 (three) times daily., Disp: , Rfl:  .  levonorgestrel (MIRENA) 20 MCG/24HR IUD, 1 each by Intrauterine route once., Disp: , Rfl:  .  losartan (COZAAR) 25 MG tablet, Take 25 mg by mouth daily., Disp: , Rfl:  .  metoprolol (LOPRESSOR) 50 MG tablet, Take 50 mg by mouth daily., Disp: , Rfl:  .  nitroGLYCERIN (NITROSTAT) 0.4 MG SL tablet, Place 0.4 mg under the tongue every 5 (five) minutes as needed for chest pain., Disp: , Rfl:  .  warfarin (COUMADIN) 5 MG tablet, Take 5 mg by mouth daily at 6 PM., Disp: , Rfl:   Allergies Amoxicillin, Penicillins, Cefdinir, and Augmentin [amoxicillin-pot clavulanate]  Family History  Problem Relation Age of Onset  . Heart attack Father     Social History Social History   Tobacco Use  . Smoking status: Never Smoker  . Smokeless tobacco: Never Used  Substance Use Topics  . Alcohol use: No  . Drug use: No    Review of Systems Constitutional: No fever ENT: No sore throat. Cardiovascular:  Denies chest pain. Respiratory: Denies shortness of breath. Gastrointestinal: Positive abdominal pain.  Positive nausea, no vomiting.  No diarrhea.  No constipation. Genitourinary: Negative for dysuria. Musculoskeletal: Some low back pain. Skin: Negative for rash.  ____________________________________________   PHYSICAL EXAM:  VITAL SIGNS: ED Triage Vitals  Enc Vitals Group     BP 03/31/19 1336 102/61     Pulse Rate 03/31/19 1336 65     Resp -- 18     Temp 03/31/19 1336 98.4 F (36.9 C)     Temp Source 03/31/19 1336 Oral     SpO2 03/31/19 1336 100  %     Weight --      Height --      Head Circumference --      Peak Flow --      Pain Score 03/31/19 1340 6     Pain Loc --      Pain Edu? --      Excl. in GC? --     Constitutional: Alert and oriented. Well appearing and in no acute distress. Eyes: Conjunctivae are normal.  ENT      Head: Normocephalic and atraumatic. Cardiovascular: Normal rate, regular rhythm. Grossly normal heart sounds.  Good peripheral circulation. Respiratory: Normal respiratory effort without tachypnea nor retractions. Breath sounds are clear and equal bilaterally. No wheezes, rales, rhonchi. Gastrointestinal:Normal Bowel sounds. No CVA tenderness.  Mild diffuse abdominal tenderness.  Moderate right lower quadrant abdominal tenderness at McBurney's point. Musculoskeletal: Steady gait.  Except: Mild tenderness lateral hip greater trochanter, full range of motion present. Neurologic:  Normal speech and language. No gross focal neurologic deficits are appreciated. Speech is normal. No gait instability.  Skin:  Skin is warm, dry and intact. No rash noted. Psychiatric: Mood and affect are normal. Speech and behavior are normal. Patient exhibits appropriate insight and judgment   ___________________________________________   LABS (all labs ordered are listed, but only abnormal results are displayed)  Labs Reviewed - No data to display PROCEDURES Procedures    INITIAL IMPRESSION / ASSESSMENT AND PLAN / ED COURSE  Pertinent labs & imaging results that were available during my care of the patient were reviewed by me and considered in my medical decision making (see chart for details).  Overall well-appearing.  Right lower quadrant abdominal pain.  Reports feels similar to previous appendicitis that was treated with IV antibiotics.  Recommend further care and further evaluation emergency room at this time as she will likely need contrast CT.  Patient agrees this plan.  [facility]  Directed to remain n.p.o. ____________________________________________   FINAL CLINICAL IMPRESSION(S) / ED DIAGNOSES  Final diagnoses:  RLQ abdominal pain     ED Discharge Orders    None       Note: This dictation was prepared with Dragon dictation along with smaller phrase technology. Any transcriptional errors that result from this process are unintentional.         Renford DillsMiller, Marixa Mellott, NP 03/31/19 1426

## 2019-03-31 NOTE — ED Triage Notes (Signed)
Right side abd pain and left hip pain.  Denies any traumatic start to issue.  Started last Wednesday - abd pain.  Hip started hurting more recently.   Last BM yesterday - normal.

## 2019-03-31 NOTE — Discharge Instructions (Addendum)
Go directly to the emergency room.  Do not eat or drink anything as discussed.

## 2019-07-21 ENCOUNTER — Other Ambulatory Visit: Payer: Self-pay

## 2019-07-21 ENCOUNTER — Ambulatory Visit
Admission: EM | Admit: 2019-07-21 | Discharge: 2019-07-21 | Disposition: A | Discharge status: Home / Self Care | Payer: Medicare HMO | Attending: Family Medicine | Admitting: Family Medicine

## 2019-07-21 ENCOUNTER — Encounter: Payer: Self-pay | Admitting: Emergency Medicine

## 2019-07-21 DIAGNOSIS — G43909 Migraine, unspecified, not intractable, without status migrainosus: Secondary | ICD-10-CM | POA: Diagnosis not present

## 2019-07-21 MED ORDER — ONDANSETRON 8 MG PO TBDP
8.0000 mg | ORAL_TABLET | Freq: Once | ORAL | Status: AC
  Administered 2019-07-21: 8 mg via ORAL

## 2019-07-21 MED ORDER — KETOROLAC TROMETHAMINE 30 MG/ML IJ SOLN
30.0000 mg | Freq: Once | INTRAMUSCULAR | Status: AC
  Administered 2019-07-21: 15:00:00 30 mg via INTRAMUSCULAR

## 2019-07-21 NOTE — Discharge Instructions (Addendum)
Rest. Drink plenty of fluids.   Follow up with your primary care physician this week as needed. Return to Urgent care as needed.  Proceed directly to the emergency room for increased headache, no improvement, numbness, confusion or worsening complaints.

## 2019-07-21 NOTE — ED Provider Notes (Signed)
[facility] ____________________________________________  Time seen: Approximately 2:51 PM  I have reviewed the triage vital signs and the nursing notes.   HISTORY  Chief Complaint Migraine   HPI Joy Adams is a 38 y.o. female past medical history of CVA, MI, hyperlipidemia, migraines presenting for chief complaint of migraine headache.  Patient reports she did not sleep as well as normal last night, tossing and turning and then woke up with a headache this morning.  States her current headache feels consistent with her migraine.  Describes headache as throbbing across the top of her head current pain moderate.  Denies current paresthesias, confusion, vision changes, unsteady gait, unilateral weakness.  Denies chest pain or shortness of breath.  Has not taken any medications for the same complaints at this time.  Denies recent sickness or fevers.  Has continued to eat and drink though does feel nauseated today.  States again this feels consistent with her normal migraine headaches.  Reports otherwise doing well.  Reports in past Toradol has worked well for her migraines.  Denies any atypical bleeding.  Dortha Kern, MD : PCP   Past Medical History:  Diagnosis Date  . Hypercholesteremia   . MI (myocardial infarction) (HCC)   . Migraines   . Stroke [facility])     There are no problems to display for this patient.   Past Surgical History:  Procedure Laterality Date  . CARDIAC SURGERY    . CHOLECYSTECTOMY       No current facility-administered medications for this encounter.  Current Outpatient Medications:  .  aspirin 325 MG tablet, Take 325 mg by mouth daily., Disp: , Rfl:  .  atorvastatin (LIPITOR) 40 MG tablet, Take 40 mg by mouth daily., Disp: , Rfl:  .  gabapentin (NEURONTIN) 600 MG tablet, Take 600 mg by mouth 3 (three) times daily., Disp: , Rfl:  .  levonorgestrel (MIRENA) 20 MCG/24HR IUD, 1 each by Intrauterine route once., Disp: , Rfl:  .  losartan  (COZAAR) 25 MG tablet, Take 25 mg by mouth daily., Disp: , Rfl:  .  metoprolol (LOPRESSOR) 50 MG tablet, Take 50 mg by mouth daily., Disp: , Rfl:  .  nitroGLYCERIN (NITROSTAT) 0.4 MG SL tablet, Place 0.4 mg under the tongue every 5 (five) minutes as needed for chest pain., Disp: , Rfl:  .  warfarin (COUMADIN) 5 MG tablet, Take 5 mg by mouth daily at 6 PM., Disp: , Rfl:  .  benzonatate (TESSALON PERLES) 100 MG capsule, Take 1 capsule (100 mg total) by mouth 3 (three) times daily as needed for cough., Disp: 15 capsule, Rfl: 0 .  butalbital-acetaminophen-caffeine (FIORICET, ESGIC) 50-325-40 MG per tablet, Take 1 tablet by mouth 2 (two) times daily as needed for headache., Disp: , Rfl:  .  chlorpheniramine-HYDROcodone (TUSSIONEX PENNKINETIC ER) 10-8 MG/5ML SUER, Take 5 mLs by mouth at bedtime as needed for cough. do not drive or operate machinery while taking as can cause drowsiness., Disp: 50 mL, Rfl: 0 .  doxycycline (VIBRAMYCIN) 100 MG capsule, Take 1 capsule (100 mg total) by mouth 2 (two) times daily., Disp: 20 capsule, Rfl: 0 .  fluticasone (FLONASE) 50 MCG/ACT nasal spray, Place 1 spray into both nostrils daily., Disp: 1 g, Rfl: 0  Allergies Amoxicillin, Penicillins, Cefdinir, Augmentin [amoxicillin-pot clavulanate], and Limonene  Family History  Problem Relation Age of Onset  . Other Mother        unknown medical history  . Heart attack Father     Social  History Social History   Tobacco Use  . Smoking status: Never Smoker  . Smokeless tobacco: Never Used  Substance Use Topics  . Alcohol use: No  . Drug use: No    Review of Systems Constitutional: No fever Eyes: No visual changes. ENT: No sore throat. Cardiovascular: Denies chest pain. Respiratory: Denies shortness of breath. Gastrointestinal: No abdominal pain. Musculoskeletal: Negative for atypical back pain. Skin: Negative for rash. Neurological: Positive for headaches. Negative focal weakness or numbness.   ____________________________________________   PHYSICAL EXAM:  VITAL SIGNS: ED Triage Vitals  Enc Vitals Group     BP 07/21/19 1421 118/61     Pulse Rate 07/21/19 1421 66     Resp 07/21/19 1421 18     Temp 07/21/19 1421 98.4 F (36.9 C)     Temp Source 07/21/19 1421 Oral     SpO2 07/21/19 1421 99 %     Weight 07/21/19 1421 204 lb (92.5 kg)     Height 07/21/19 1421 5' (1.524 m)     Head Circumference --      Peak Flow --      Pain Score 07/21/19 1420 7     Pain Loc --      Pain Edu? --      Excl. in GC? --     Constitutional: Alert and oriented. Well appearing and in no acute distress. Eyes: Conjunctivae are normal. PERRL. EOMI. ENT      Head: Normocephalic and atraumatic. Cardiovascular: Normal rate, regular rhythm. Grossly normal heart sounds.  Good peripheral circulation. Respiratory: Normal respiratory effort without tachypnea nor retractions. Breath sounds are clear and equal bilaterally. No wheezes, rales, rhonchi. Gastrointestinal: Soft and nontender. No distention. Normal Bowel sounds. No CVA tenderness. Musculoskeletal:  Steady gait. 5/5 strength to bilateral upper and lower extremities. Neurologic:  Normal speech and language. No gross focal neurologic deficits are appreciated. Speech is normal. No gait instability.  Negative pronator drift.  No ataxia.  Normal finger-to-nose.  No paresthesia. Skin:  Skin is warm, dry and intact. No rash noted. Psychiatric: Mood and affect are normal. Speech and behavior are normal. Patient exhibits appropriate insight and judgment   ___________________________________________   LABS (all labs ordered are listed, but only abnormal results are displayed)  Labs Reviewed - No data to display  PROCEDURES Procedures    INITIAL IMPRESSION / ASSESSMENT AND PLAN / ED COURSE  Pertinent labs & imaging results that were available during my care of the patient were reviewed by me and considered in my medical decision making (see chart  for details).  Well-appearing patient.  No focal neurological deficit.  History of migraines with similar presentation.  Reports otherwise doing well denies other complaints.  Patient is on Coumadin, counseled regarding use of NSAID.  30 mg Toradol given once in urgent care and 8 ODT Zofran.  Patient states that her next INR check is this week, counseled to follow this as well as to monitor for any adverse side effects of bleeding.  Discussed follow up with Primary care physician this week. Discussed follow up and return parameters including no resolution or any worsening concerns. Patient verbalized understanding and agreed to plan.   ____________________________________________   FINAL CLINICAL IMPRESSION(S) / ED DIAGNOSES  Final diagnoses:  Migraine without status migrainosus, not intractable, unspecified migraine type     ED Discharge Orders    None       Note: This dictation was prepared with Dragon dictation along with smaller phrase technology. Any transcriptional  errors that result from this process are unintentional.         Marylene Land, NP 07/21/19 1635

## 2019-07-21 NOTE — ED Triage Notes (Signed)
Patient in today c/o migraine when she woke up this morning. Patient has not taken any medication.

## 2019-09-17 ENCOUNTER — Other Ambulatory Visit: Payer: Self-pay

## 2019-09-17 ENCOUNTER — Ambulatory Visit
Admission: EM | Admit: 2019-09-17 | Discharge: 2019-09-17 | Disposition: A | Discharge status: Home / Self Care | Payer: Medicare HMO | Attending: Urgent Care | Admitting: Urgent Care

## 2019-09-17 DIAGNOSIS — Z7901 Long term (current) use of anticoagulants: Secondary | ICD-10-CM | POA: Insufficient documentation

## 2019-09-17 DIAGNOSIS — Z9049 Acquired absence of other specified parts of digestive tract: Secondary | ICD-10-CM | POA: Insufficient documentation

## 2019-09-17 DIAGNOSIS — Z793 Long term (current) use of hormonal contraceptives: Secondary | ICD-10-CM | POA: Insufficient documentation

## 2019-09-17 DIAGNOSIS — Z8673 Personal history of transient ischemic attack (TIA), and cerebral infarction without residual deficits: Secondary | ICD-10-CM | POA: Insufficient documentation

## 2019-09-17 DIAGNOSIS — J3489 Other specified disorders of nose and nasal sinuses: Secondary | ICD-10-CM

## 2019-09-17 DIAGNOSIS — I252 Old myocardial infarction: Secondary | ICD-10-CM | POA: Diagnosis not present

## 2019-09-17 DIAGNOSIS — Z881 Allergy status to other antibiotic agents status: Secondary | ICD-10-CM | POA: Insufficient documentation

## 2019-09-17 DIAGNOSIS — U071 COVID-19: Secondary | ICD-10-CM | POA: Insufficient documentation

## 2019-09-17 DIAGNOSIS — E7800 Pure hypercholesterolemia, unspecified: Secondary | ICD-10-CM | POA: Insufficient documentation

## 2019-09-17 DIAGNOSIS — Z7982 Long term (current) use of aspirin: Secondary | ICD-10-CM | POA: Insufficient documentation

## 2019-09-17 DIAGNOSIS — J069 Acute upper respiratory infection, unspecified: Secondary | ICD-10-CM

## 2019-09-17 DIAGNOSIS — Z883 Allergy status to other anti-infective agents status: Secondary | ICD-10-CM | POA: Diagnosis not present

## 2019-09-17 DIAGNOSIS — Z880 Allergy status to penicillin: Secondary | ICD-10-CM | POA: Insufficient documentation

## 2019-09-17 DIAGNOSIS — R519 Headache, unspecified: Secondary | ICD-10-CM | POA: Diagnosis present

## 2019-09-17 DIAGNOSIS — Z79899 Other long term (current) drug therapy: Secondary | ICD-10-CM | POA: Insufficient documentation

## 2019-09-17 DIAGNOSIS — Z8249 Family history of ischemic heart disease and other diseases of the circulatory system: Secondary | ICD-10-CM | POA: Diagnosis not present

## 2019-09-17 MED ORDER — CETIRIZINE HCL 10 MG PO TABS: 10.0000 mg | ORAL_TABLET | Freq: Every day | ORAL | 0 refills | Status: AC

## 2019-09-17 NOTE — ED Provider Notes (Signed)
[facility]    CSN: [id number] Arrival date & time: 09/17/19  1627      History   Chief Complaint Chief Complaint  Patient presents with  . Headache  . Cough    HPI REMELL GIAIMO is a 39 y.o. female.   Patient is a 39 year old female who presents with chief complaint of body aches, sinus congestion, cough, burning sensation in her nose, and severe headache/sinus pressure.  Patient states her symptoms began yesterday.  She reports her uncle, who lives in the same home with her has similar symptoms and is being tested today for Covid.  Patient states she is not taking any over-the-counter medications for this.  Patient denies any sputum production.  Patient denies any abdominal pain, nausea, or vomiting.  Patient denies any known Covid contacts.  She states she does not work due to disability.  Patient does report history of seasonal allergy issues in the past.     Past Medical History:  Diagnosis Date  . Hypercholesteremia   . MI (myocardial infarction) (HCC)   . Migraines   . Stroke [facility])     There are no problems to display for this patient.   Past Surgical History:  Procedure Laterality Date  . CARDIAC SURGERY    . CHOLECYSTECTOMY      OB History   No obstetric history on file.      Home Medications    Prior to Admission medications   Medication Sig Start Date End Date Taking? Authorizing Provider  aspirin 325 MG tablet Take 325 mg by mouth daily.   Yes [provider]  atorvastatin (LIPITOR) 40 MG tablet Take 40 mg by mouth daily.   Yes [provider]  butalbital-acetaminophen-caffeine (FIORICET, ESGIC) 50-325-40 MG per tablet Take 1 tablet by mouth 2 (two) times daily as needed for headache.   Yes [provider]  escitalopram (LEXAPRO) 10 MG tablet Take 3 tablets by mouth daily. 09/17/19  Yes [provider]  fluticasone (FLONASE) 50 MCG/ACT nasal spray Place 1 spray into both nostrils daily. 04/02/18  Yes  Law, Waylan Boga, PA-C  furosemide (LASIX) 20 MG tablet Take 1 tablet by mouth daily as needed.   Yes [provider]  gabapentin (NEURONTIN) 600 MG tablet Take 600 mg by mouth 3 (three) times daily.   Yes [provider]  levonorgestrel (MIRENA) 20 MCG/24HR IUD 1 each by Intrauterine route once.   Yes [provider]  losartan-hydrochlorothiazide (HYZAAR) 100-12.5 MG tablet Take 1 tablet by mouth daily.   Yes [provider]  magnesium oxide (MAG-OX) 400 (241.3 Mg) MG tablet Take 2 tablets by mouth at bedtime. 07/29/19  Yes [provider]  metoprolol (LOPRESSOR) 50 MG tablet Take 50 mg by mouth daily.   Yes [provider]  nitroGLYCERIN (NITROSTAT) 0.4 MG SL tablet Place 0.4 mg under the tongue every 5 (five) minutes as needed for chest pain.   Yes [provider]  pantoprazole (PROTONIX) 20 MG tablet Take 20 mg by mouth daily. 08/06/19  Yes [provider]  TROKENDI XR 200 MG CP24 Take 1 capsule by mouth at bedtime. 08/22/19  Yes [provider]  warfarin (COUMADIN) 5 MG tablet Take 5 mg by mouth daily at 6 PM.   Yes [provider]  losartan (COZAAR) 25 MG tablet Take 25 mg by mouth daily.  09/17/19 Yes [provider]  cetirizine (ZYRTEC) 10 MG tablet Take 1 tablet (10 mg total) by mouth daily. 09/17/19  Candis Schatz, PA-C    Family History Family History  Problem Relation Age of Onset  . Other Mother        unknown medical history  . Heart attack Father     Social History Social History   Tobacco Use  . Smoking status: Never Smoker  . Smokeless tobacco: Never Used  Substance Use Topics  . Alcohol use: No  . Drug use: No     Allergies   Amoxicillin, Penicillins, Cefdinir, Augmentin [amoxicillin-pot clavulanate], and Limonene   Review of Systems Review of Systems as noted above in HPI.  Other systems reviewed and found to be negative.   Physical Exam Triage Vital  Signs ED Triage Vitals  Enc Vitals Group     BP 09/17/19 1700 111/72     Pulse Rate 09/17/19 1700 76     Resp 09/17/19 1700 19     Temp 09/17/19 1700 98.7 F (37.1 C)     Temp Source 09/17/19 1700 Oral     SpO2 09/17/19 1700 99 %     Weight 09/17/19 1655 210 lb (95.3 kg)     Height 09/17/19 1655 5\' 4"  (1.626 m)     Head Circumference --      Peak Flow --      Pain Score 09/17/19 1655 8     Pain Loc --      Pain Edu? --      Excl. in GC? --    No data found.  Updated Vital Signs BP 111/72 (BP Location: Left Arm)   Pulse 76   Temp 98.7 F (37.1 C) (Oral)   Resp 19   Ht 5\' 4"  (1.626 m)   Wt 210 lb (95.3 kg)   SpO2 99%   BMI 36.05 kg/m    Physical Exam Constitutional:      Appearance: She is not ill-appearing.     Comments: Patient slow, purposeful movements and a little slow in answering questions  HENT:     Right Ear: A middle ear effusion is present. Tympanic membrane is not injected or erythematous.     Left Ear: A middle ear effusion is present. Tympanic membrane is not injected or erythematous.     Nose:     Right Sinus: Maxillary sinus tenderness present. No frontal sinus tenderness.     Left Sinus: Maxillary sinus tenderness present. No frontal sinus tenderness.     Mouth/Throat:     Mouth: Mucous membranes are moist.     Tonsils: 0 on the right. 0 on the left.     Comments: Clear posterior oropharynx drainage.  Mild erythema to the back of the throat Eyes:     Extraocular Movements: Extraocular movements intact.     Pupils: Pupils are equal, round, and reactive to light.  Cardiovascular:     Rate and Rhythm: Normal rate and regular rhythm.     Pulses: Normal pulses.     Heart sounds: Normal heart sounds. No murmur.  Pulmonary:     Effort: Pulmonary effort is normal. No respiratory distress.     Breath sounds: Normal breath sounds. No stridor.  Abdominal:     General: Abdomen is flat.     Palpations: Abdomen is soft.  Musculoskeletal:        General:  Normal range of motion.  Skin:    General: Skin is warm and dry.     Capillary Refill: Capillary refill takes less than 2 seconds.  Neurological:     General:  No focal deficit present.     Mental Status: She is alert and oriented to person, place, and time.  Psychiatric:        Mood and Affect: Mood normal.        Behavior: Behavior normal.      UC Treatments / Results  Labs (all labs ordered are listed, but only abnormal results are displayed) Labs Reviewed  NOVEL CORONAVIRUS, NAA (HOSP ORDER, SEND-OUT TO REF LAB; TAT 18-24 HRS)    EKG   Radiology No results found.  Procedures Procedures (including critical care time)  Medications Ordered in UC Medications - No data to display  Initial Impression / Assessment and Plan / UC Course  I have reviewed the triage vital signs and the nursing notes.  Pertinent labs & imaging results that were available during my care of the patient were reviewed by me and considered in my medical decision making (see chart for details).    Patient with generalized body aches, cough, sore throat, sinus pressure and headache.  Patient with family member with similar symptoms.  Patient does have fluid in both ears and maxillary sinus tenderness.  Differential includes COVID-19 infection, Upper respiratory infection with other viral etiology, sinus congestion with associated ear effusion, or combination of these.  Send Covid test.  Your prescription for Zyrtec.  Continue home Flonase.  Over-the-counter treatment recommendations for her sinus congestion. Final Clinical Impressions(s) / UC Diagnoses   Final diagnoses:  Viral upper respiratory tract infection  Sinus pain  Nonintractable headache, unspecified chronicity pattern, unspecified headache type     Discharge Instructions     -Continue Flonase to transfer morning.  Add Zyrtec nightly -Ibuprofen or Tylenol as needed for pain -Afrin over-the-counter spray, to both nostrils to help  open up passages -Over-the-counter nasal saline rinse or Nettie pot like product to help open up the sinuses and pressure -COVID test should return in 24-48 hours -Self quarantine for next 2 days until test returns. -See attached for further information    ED Prescriptions    Medication Sig Dispense Auth. Provider   cetirizine (ZYRTEC) 10 MG tablet Take 1 tablet (10 mg total) by mouth daily. 30 tablet Luvenia Redden, PA-C     I have reviewed the PDMP during this encounter.   Luvenia Redden, PA-C 09/17/19 1740

## 2019-09-17 NOTE — Discharge Instructions (Addendum)
-  Continue Flonase to transfer morning.  Add Zyrtec nightly -Ibuprofen or Tylenol as needed for pain -Afrin over-the-counter spray, to both nostrils to help open up passages -Over-the-counter nasal saline rinse or Nettie pot like product to help open up the sinuses and pressure -COVID test should return in 24-48 hours -Self quarantine for next 2 days until test returns. -See attached for further information

## 2019-09-17 NOTE — ED Triage Notes (Signed)
Pt presents with c/o severe headache, cough, dry nose that feels like it's burning, dizziness and general body aches. Pt reports she has live-in family member with similar symptoms who was tested for COVID today.

## 2019-09-18 LAB — NOVEL CORONAVIRUS, NAA (HOSP ORDER, SEND-OUT TO REF LAB; TAT 18-24 HRS): SARS-CoV-2, NAA: DETECTED — AB

## 2019-09-19 ENCOUNTER — Ambulatory Visit (HOSPITAL_COMMUNITY)
Admission: RE | Admit: 2019-09-19 | Discharge: 2019-09-19 | Disposition: A | Discharge status: Home / Self Care | Payer: Medicare Other | Source: Ambulatory Visit | Attending: Pulmonary Disease | Admitting: Pulmonary Disease

## 2019-09-19 ENCOUNTER — Other Ambulatory Visit: Payer: Self-pay | Admitting: Adult Health

## 2019-09-19 ENCOUNTER — Telehealth: Payer: Self-pay | Admitting: Adult Health

## 2019-09-19 DIAGNOSIS — U071 COVID-19: Secondary | ICD-10-CM | POA: Insufficient documentation

## 2019-09-19 DIAGNOSIS — Z23 Encounter for immunization: Secondary | ICD-10-CM | POA: Diagnosis not present

## 2019-09-19 MED ORDER — SODIUM CHLORIDE 0.9 % IV SOLN: INTRAVENOUS | Status: DC | PRN

## 2019-09-19 MED ORDER — DIPHENHYDRAMINE HCL 50 MG/ML IJ SOLN: 50.0000 mg | Freq: Once | INTRAMUSCULAR | Status: DC | PRN

## 2019-09-19 MED ORDER — EPINEPHRINE 0.3 MG/0.3ML IJ SOAJ: 0.3000 mg | Freq: Once | INTRAMUSCULAR | Status: DC | PRN

## 2019-09-19 MED ORDER — FAMOTIDINE IN NACL 20-0.9 MG/50ML-% IV SOLN: 20.0000 mg | Freq: Once | INTRAVENOUS | Status: DC | PRN

## 2019-09-19 MED ORDER — SODIUM CHLORIDE 0.9 % IV SOLN
700.0000 mg | Freq: Once | INTRAVENOUS | Status: AC
  Administered 2019-09-19: 700 mg via INTRAVENOUS
  Filled 2019-09-19: qty 20

## 2019-09-19 MED ORDER — ALBUTEROL SULFATE HFA 108 (90 BASE) MCG/ACT IN AERS: 2.0000 | INHALATION_SPRAY | Freq: Once | RESPIRATORY_TRACT | Status: DC | PRN

## 2019-09-19 MED ORDER — METHYLPREDNISOLONE SODIUM SUCC 125 MG IJ SOLR: 125.0000 mg | Freq: Once | INTRAMUSCULAR | Status: DC | PRN

## 2019-09-19 NOTE — Progress Notes (Signed)
  Diagnosis: COVID-19  Physician: Dr. Wright  Procedure: Covid Infusion Clinic Med: bamlanivimab infusion - Provided patient with bamlanimivab fact sheet for patients, parents and caregivers prior to infusion.  Complications: No immediate complications noted.  Discharge: Discharged home   Jadamarie Butson A 09/19/2019  

## 2019-09-19 NOTE — Discharge Instructions (Signed)

## 2019-09-19 NOTE — Telephone Encounter (Signed)
  I connected by phone with Joy Adams on 09/19/2019 at 10:59 AM to discuss the potential use of an new treatment for mild to moderate COVID-19 viral infection in non-hospitalized patients.  This patient is a 39 y.o. female that meets the FDA criteria for Emergency Use Authorization of bamlanivimab or casirivimab\imdevimab.  Has a (+) direct SARS-CoV-2 viral test result  Has mild or moderate COVID-19   Is ? 39 years of age and weighs ? 40 kg  Is NOT hospitalized due to COVID-19  Is NOT requiring oxygen therapy or requiring an increase in baseline oxygen flow rate due to COVID-19  Is within 10 days of symptom - sx onset 2/9  Has at least one of the high risk factor(s) for progression to severe COVID-19 and/or hospitalization as defined in EUA.  Specific high risk criteria : BMI >/= 35   I have spoken and communicated the following to the patient or parent/caregiver:  1. FDA has authorized the emergency use of bamlanivimab and casirivimab\imdevimab for the treatment of mild to moderate COVID-19 in adults and pediatric patients with positive results of direct SARS-CoV-2 viral testing who are 26 years of age and older weighing at least 40 kg, and who are at high risk for progressing to severe COVID-19 and/or hospitalization.  2. The significant known and potential risks and benefits of bamlanivimab and casirivimab\imdevimab, and the extent to which such potential risks and benefits are unknown.  3. Information on available alternative treatments and the risks and benefits of those alternatives, including clinical trials.  4. Patients treated with bamlanivimab and casirivimab\imdevimab should continue to self-isolate and use infection control measures (e.g., wear mask, isolate, social distance, avoid sharing personal items, clean and disinfect "high touch" surfaces, and frequent handwashing) according to CDC guidelines.   5. The patient or parent/caregiver has the option to accept or  refuse bamlanivimab or casirivimab\imdevimab .  After reviewing this information with the patient, The patient agreed to proceed with receiving the bamlanimivab infusion and will be provided a copy of the Fact sheet prior to receiving the infusion.Orpha Bur D Jakhari Space 09/19/2019 10:59 AM

## 2019-10-24 ENCOUNTER — Other Ambulatory Visit: Payer: Self-pay

## 2019-10-24 ENCOUNTER — Ambulatory Visit: Payer: Medicare HMO | Attending: Internal Medicine

## 2019-10-24 DIAGNOSIS — Z23 Encounter for immunization: Secondary | ICD-10-CM

## 2019-10-24 NOTE — Progress Notes (Signed)
   Covid-19 Vaccination Clinic  Name:  Joy Adams    MRN: [id number] DOB: 1981-03-17  10/24/2019  Ms. Joy Adams was observed post Covid-19 immunization for 15 minutes without incident. She was provided with Vaccine Information Sheet and instruction to access the V-Safe system.   Ms. Joy Adams was instructed to call 911 with any severe reactions post vaccine: Marland Kitchen Difficulty breathing  . Swelling of face and throat  . A fast heartbeat  . A bad rash all over body  . Dizziness and weakness   Immunizations Administered    Name Date Dose VIS Date Route   Pfizer COVID-19 Vaccine 10/24/2019 10:39 AM 0.3 mL 07/18/2019 Intramuscular   Manufacturer: [employer], [employer]   Lot: [id number]   NDC: [address]-2

## 2019-11-18 ENCOUNTER — Ambulatory Visit: Payer: Medicare HMO | Attending: Internal Medicine

## 2019-11-18 DIAGNOSIS — Z23 Encounter for immunization: Secondary | ICD-10-CM

## 2019-11-18 NOTE — Progress Notes (Signed)
   Covid-19 Vaccination Clinic  Name:  Joy Adams    MRN: [id number] DOB: June 28, 1981  11/18/2019  [name] was observed post Covid-19 immunization for 15 minutes without incident. She was provided with Vaccine Information Sheet and instruction to access the V-Safe system.   [name] was instructed to call 911 with any severe reactions post vaccine: Marland Kitchen Difficulty breathing  . Swelling of face and throat  . A fast heartbeat  . A bad rash all over body  . Dizziness and weakness   Immunizations Administered    Name Date Dose VIS Date Route   Pfizer COVID-19 Vaccine 11/18/2019 11:07 AM 0.3 mL 07/18/2019 Intramuscular   Manufacturer: [employer], [employer]   Lot: [id number]   NDC: [address]-7

## 2019-11-28 DIAGNOSIS — K148 Other diseases of tongue: Secondary | ICD-10-CM | POA: Insufficient documentation

## 2019-11-28 DIAGNOSIS — R4189 Other symptoms and signs involving cognitive functions and awareness: Secondary | ICD-10-CM | POA: Insufficient documentation

## 2019-11-28 DIAGNOSIS — G890 Central pain syndrome: Secondary | ICD-10-CM | POA: Insufficient documentation

## 2020-06-26 ENCOUNTER — Ambulatory Visit: Payer: Medicare HMO

## 2020-07-11 ENCOUNTER — Ambulatory Visit
Admission: EM | Admit: 2020-07-11 | Discharge: 2020-07-11 | Disposition: A | Discharge status: Home / Self Care | Payer: Medicare Other | Attending: Emergency Medicine | Admitting: Emergency Medicine

## 2020-07-11 ENCOUNTER — Other Ambulatory Visit: Payer: Self-pay

## 2020-07-11 DIAGNOSIS — L231 Allergic contact dermatitis due to adhesives: Secondary | ICD-10-CM | POA: Diagnosis not present

## 2020-07-11 MED ORDER — TRIAMCINOLONE ACETONIDE 0.1 % EX CREA: 1.0000 "application " | TOPICAL_CREAM | Freq: Two times a day (BID) | CUTANEOUS | 0 refills | Status: DC

## 2020-07-11 NOTE — Discharge Instructions (Addendum)
The triamcinolone to the rash twice daily for a week.  I would advise you to notify healthcare providers that she might have a sensitivity to adhesives in the future.

## 2020-07-11 NOTE — ED Provider Notes (Signed)
[facility]    CSN: [id number] Arrival date & time: 07/11/20  1440      History   Chief Complaint Chief Complaint  Patient presents with  . Rash    HPI Joy Adams is a 39 y.o. female.   HPI   39 year old female here for evaluation of rash in both of her antecubitals that she is had for a week.  Patient reports that the area is itchy and she has been taking Benadryl which has not helped. Patient states that she has had this ever since she had IVs placed at [facility] to treat her for migraine. Patient states that she has had some sensitivity to latex in the past.  Past Medical History:  Diagnosis Date  . Hypercholesteremia   . MI (myocardial infarction) (HCC)   . Migraines   . Stroke [facility])     There are no problems to display for this patient.   Past Surgical History:  Procedure Laterality Date  . CARDIAC SURGERY    . CHOLECYSTECTOMY      OB History   No obstetric history on file.      Home Medications    Prior to Admission medications   Medication Sig Start Date End Date Taking? Authorizing Provider  aspirin 325 MG tablet Take 325 mg by mouth daily.    [provider]  atorvastatin (LIPITOR) 40 MG tablet Take 40 mg by mouth daily.    [provider]  butalbital-acetaminophen-caffeine (FIORICET, ESGIC) 50-325-40 MG per tablet Take 1 tablet by mouth 2 (two) times daily as needed for headache.    [provider]  cetirizine (ZYRTEC) 10 MG tablet Take 1 tablet (10 mg total) by mouth daily. 09/17/19   Candis Schatz, PA-C  escitalopram (LEXAPRO) 10 MG tablet Take 3 tablets by mouth daily. 09/17/19   [provider]  fluticasone (FLONASE) 50 MCG/ACT nasal spray Place 1 spray into both nostrils daily. 04/02/18   Law, Waylan Boga, PA-C  furosemide (LASIX) 20 MG tablet Take 1 tablet by mouth daily as needed.    [provider]  gabapentin (NEURONTIN) 600 MG tablet Take 600 mg by mouth 3 (three) times daily.     [provider]  levonorgestrel (MIRENA) 20 MCG/24HR IUD 1 each by Intrauterine route once.    [provider]  losartan-hydrochlorothiazide (HYZAAR) 100-12.5 MG tablet Take 1 tablet by mouth daily.    [provider]  magnesium oxide (MAG-OX) 400 (241.3 Mg) MG tablet Take 2 tablets by mouth at bedtime. 07/29/19   [provider]  metoprolol (LOPRESSOR) 50 MG tablet Take 50 mg by mouth daily.    [provider]  nitroGLYCERIN (NITROSTAT) 0.4 MG SL tablet Place 0.4 mg under the tongue every 5 (five) minutes as needed for chest pain.    [provider]  pantoprazole (PROTONIX) 20 MG tablet Take 20 mg by mouth daily. 08/06/19   [provider]  triamcinolone (KENALOG) 0.1 % Apply 1 application topically 2 (two) times daily. 07/11/20   Becky Augusta, NP  TROKENDI XR 200 MG CP24 Take 1 capsule by mouth at bedtime. 08/22/19   [provider]  warfarin (COUMADIN) 5 MG tablet Take 5 mg by mouth daily at 6 PM.    [provider]  losartan (COZAAR) 25 MG tablet Take 25 mg by mouth daily.  09/17/19  [provider]    Family History Family History  Problem Relation Age of Onset  . Other Mother  unknown medical history  . Heart attack Father     Social History Social History   Tobacco Use  . Smoking status: Never Smoker  . Smokeless tobacco: Never Used  Vaping Use  . Vaping Use: Never used  Substance Use Topics  . Alcohol use: No  . Drug use: No     Allergies   Amoxicillin, Penicillins, Cefdinir, Augmentin [amoxicillin-pot clavulanate], and Limonene   Review of Systems Review of Systems  Constitutional: Negative for activity change, appetite change, fatigue and fever.  Skin: Positive for rash.  Hematological: Negative.   Psychiatric/Behavioral: Negative.      Physical Exam Triage Vital Signs ED Triage Vitals  Enc Vitals Group     BP 07/11/20 1543 (!) 121/93     Pulse Rate 07/11/20 1543  79     Resp 07/11/20 1543 20     Temp 07/11/20 1543 97.9 F (36.6 C)     Temp Source 07/11/20 1543 Oral     SpO2 07/11/20 1543 96 %     Weight --      Height --      Head Circumference --      Peak Flow --      Pain Score 07/11/20 1544 0     Pain Loc --      Pain Edu? --      Excl. in GC? --    No data found.  Updated Vital Signs BP (!) 121/93 (BP Location: Left Arm)   Pulse 79   Temp 97.9 F (36.6 C) (Oral)   Resp 20   SpO2 96%   Visual Acuity Right Eye Distance:   Left Eye Distance:   Bilateral Distance:    Right Eye Near:   Left Eye Near:    Bilateral Near:     Physical Exam Vitals and nursing note reviewed.  Constitutional:      General: She is not in acute distress.    Appearance: Normal appearance. She is obese. She is not toxic-appearing.  HENT:     Head: Normocephalic and atraumatic.  Eyes:     General: No scleral icterus.    Extraocular Movements: Extraocular movements intact.     Conjunctiva/sclera: Conjunctivae normal.     Pupils: Pupils are equal, round, and reactive to light.  Musculoskeletal:        General: No swelling or tenderness. Normal range of motion.  Skin:    General: Skin is warm and dry.     Capillary Refill: Capillary refill takes less than 2 seconds.     Findings: Rash present.     Comments: Patient has a fine red rash in bilateral antecubital fossa's and scattered down the anterior aspect of both forearms. The rash is itchy in nature and will sometimes become more red..  Neurological:     General: No focal deficit present.     Mental Status: She is alert and oriented to person, place, and time.  Psychiatric:        Mood and Affect: Mood normal.        Behavior: Behavior normal.      UC Treatments / Results  Labs (all labs ordered are listed, but only abnormal results are displayed) Labs Reviewed - No data to display  EKG   Radiology No results found.  Procedures Procedures (including critical care  time)  Medications Ordered in UC Medications - No data to display  Initial Impression / Assessment and Plan / UC Course  I have reviewed the triage  vital signs and the nursing notes.  Pertinent labs & imaging results that were available during my care of the patient were reviewed by me and considered in my medical decision making (see chart for details).    Rash is consistent with contact dermatitis. Patient may very well have had a reaction to the Tegaderm from her IV placement as this is where the redness started has not resolved. Will treat with triamcinolone ointment twice daily for a week   Final Clinical Impressions(s) / UC Diagnoses   Final diagnoses:  Contact dermatitis due to adhesives, unspecified contact dermatitis type     Discharge Instructions     The triamcinolone to the rash twice daily for a week.  I would advise you to notify healthcare providers that she might have a sensitivity to adhesives in the future.    ED Prescriptions    Medication Sig Dispense Auth. Provider   triamcinolone (KENALOG) 0.1 % Apply 1 application topically 2 (two) times daily. 30 g Becky Augusta, NP     PDMP not reviewed this encounter.   Becky Augusta, NP 07/11/20 1605

## 2020-07-11 NOTE — ED Triage Notes (Signed)
Pt presents with a rash on both arms for over a week.

## 2021-01-21 DIAGNOSIS — G43909 Migraine, unspecified, not intractable, without status migrainosus: Secondary | ICD-10-CM | POA: Insufficient documentation

## 2021-01-21 DIAGNOSIS — R9431 Abnormal electrocardiogram [ECG] [EKG]: Secondary | ICD-10-CM | POA: Insufficient documentation

## 2021-01-21 DIAGNOSIS — R531 Weakness: Secondary | ICD-10-CM | POA: Insufficient documentation

## 2021-01-21 DIAGNOSIS — Z7901 Long term (current) use of anticoagulants: Secondary | ICD-10-CM | POA: Insufficient documentation

## 2021-04-11 ENCOUNTER — Encounter (HOSPITAL_COMMUNITY): Payer: Self-pay

## 2021-04-11 ENCOUNTER — Emergency Department (HOSPITAL_COMMUNITY)
Admission: EM | Admit: 2021-04-11 | Discharge: 2021-04-11 | Disposition: A | Discharge status: Home / Self Care | Payer: Medicare HMO | Attending: Emergency Medicine | Admitting: Emergency Medicine

## 2021-04-11 ENCOUNTER — Emergency Department (HOSPITAL_COMMUNITY): Payer: Medicare HMO

## 2021-04-11 ENCOUNTER — Other Ambulatory Visit: Payer: Self-pay

## 2021-04-11 DIAGNOSIS — M542 Cervicalgia: Secondary | ICD-10-CM | POA: Diagnosis not present

## 2021-04-11 DIAGNOSIS — S20219A Contusion of unspecified front wall of thorax, initial encounter: Secondary | ICD-10-CM | POA: Diagnosis not present

## 2021-04-11 DIAGNOSIS — S299XXA Unspecified injury of thorax, initial encounter: Secondary | ICD-10-CM | POA: Diagnosis present

## 2021-04-11 DIAGNOSIS — S0990XA Unspecified injury of head, initial encounter: Secondary | ICD-10-CM | POA: Insufficient documentation

## 2021-04-11 DIAGNOSIS — R0789 Other chest pain: Secondary | ICD-10-CM | POA: Diagnosis not present

## 2021-04-11 DIAGNOSIS — Z7901 Long term (current) use of anticoagulants: Secondary | ICD-10-CM | POA: Diagnosis not present

## 2021-04-11 DIAGNOSIS — S301XXA Contusion of abdominal wall, initial encounter: Secondary | ICD-10-CM | POA: Insufficient documentation

## 2021-04-11 DIAGNOSIS — Y92410 Unspecified street and highway as the place of occurrence of the external cause: Secondary | ICD-10-CM | POA: Insufficient documentation

## 2021-04-11 DIAGNOSIS — T1490XA Injury, unspecified, initial encounter: Secondary | ICD-10-CM

## 2021-04-11 DIAGNOSIS — Z7982 Long term (current) use of aspirin: Secondary | ICD-10-CM | POA: Insufficient documentation

## 2021-04-11 DIAGNOSIS — Z20822 Contact with and (suspected) exposure to covid-19: Secondary | ICD-10-CM | POA: Diagnosis not present

## 2021-04-11 DIAGNOSIS — M549 Dorsalgia, unspecified: Secondary | ICD-10-CM | POA: Insufficient documentation

## 2021-04-11 LAB — COMPREHENSIVE METABOLIC PANEL
ALT: 53 U/L — ABNORMAL HIGH (ref 0–44)
AST: 38 U/L (ref 15–41)
Albumin: 3.3 g/dL — ABNORMAL LOW (ref 3.5–5.0)
Alkaline Phosphatase: 101 U/L (ref 38–126)
Anion gap: 6 (ref 5–15)
BUN: 10 mg/dL (ref 6–20)
CO2: 25 mmol/L (ref 22–32)
Calcium: 8.3 mg/dL — ABNORMAL LOW (ref 8.9–10.3)
Chloride: 107 mmol/L (ref 98–111)
Creatinine, Ser: 1.06 mg/dL — ABNORMAL HIGH (ref 0.44–1.00)
GFR, Estimated: >60 mL/min (ref >60)
Glucose, Bld: 160 mg/dL — ABNORMAL HIGH (ref 70–99)
Potassium: 3 mmol/L — ABNORMAL LOW (ref 3.5–5.1)
Sodium: 138 mmol/L (ref 135–145)
Total Bilirubin: 1.1 mg/dL (ref 0.3–1.2)
Total Protein: 5.9 g/dL — ABNORMAL LOW (ref 6.5–8.1)

## 2021-04-11 LAB — URINALYSIS, ROUTINE W REFLEX MICROSCOPIC
Bilirubin Urine: NEGATIVE
Glucose, UA: NEGATIVE mg/dL
Hgb urine dipstick: NEGATIVE
Ketones, ur: NEGATIVE mg/dL
Nitrite: NEGATIVE
Protein, ur: NEGATIVE mg/dL
Specific Gravity, Urine: 1.012 (ref 1.005–1.030)
pH: 6 (ref 5.0–8.0)

## 2021-04-11 LAB — I-STAT CHEM 8, ED
BUN: 9 mg/dL (ref 6–20)
Calcium, Ion: 1.15 mmol/L (ref 1.15–1.40)
Chloride: 104 mmol/L (ref 98–111)
Creatinine, Ser: 1 mg/dL (ref 0.44–1.00)
Glucose, Bld: 157 mg/dL — ABNORMAL HIGH (ref 70–99)
HCT: 41 % (ref 36.0–46.0)
Hemoglobin: 13.9 g/dL (ref 12.0–15.0)
Potassium: 3.1 mmol/L — ABNORMAL LOW (ref 3.5–5.1)
Sodium: 142 mmol/L (ref 135–145)
TCO2: 25 mmol/L (ref 22–32)

## 2021-04-11 LAB — I-STAT BETA HCG BLOOD, ED (MC, WL, AP ONLY): I-stat hCG, quantitative: <5 m[IU]/mL (ref <5)

## 2021-04-11 LAB — CBC
HCT: 43.4 % (ref 36.0–46.0)
Hemoglobin: 14.7 g/dL (ref 12.0–15.0)
MCH: 32.2 pg (ref 26.0–34.0)
MCHC: 33.9 g/dL (ref 30.0–36.0)
MCV: 95 fL (ref 80.0–100.0)
Platelets: 253 10*3/uL (ref 150–400)
RBC: 4.57 MIL/uL (ref 3.87–5.11)
RDW: 12.4 % (ref 11.5–15.5)
WBC: 12 10*3/uL — ABNORMAL HIGH (ref 4.0–10.5)
nRBC: 0 % (ref 0.0–0.2)

## 2021-04-11 LAB — TYPE AND SCREEN
ABO/RH(D): O POS
Antibody Screen: NEGATIVE

## 2021-04-11 LAB — RESP PANEL BY RT-PCR (FLU A&B, COVID) ARPGX2
Influenza A by PCR: NEGATIVE
Influenza B by PCR: NEGATIVE
SARS Coronavirus 2 by RT PCR: NEGATIVE

## 2021-04-11 LAB — PROTIME-INR
INR: 2.4 — ABNORMAL HIGH (ref 0.8–1.2)
Prothrombin Time: 26 seconds — ABNORMAL HIGH (ref 11.4–15.2)

## 2021-04-11 LAB — LACTIC ACID, PLASMA: Lactic Acid, Venous: 2.2 mmol/L (ref 0.5–1.9)

## 2021-04-11 MED ORDER — HYDROCODONE-ACETAMINOPHEN 5-325 MG PO TABS
1.0000 | ORAL_TABLET | Freq: Four times a day (QID) | ORAL | 0 refills | Status: DC | PRN
Start: 2021-04-11 — End: 2021-05-09

## 2021-04-11 MED ORDER — SODIUM CHLORIDE 0.9 % IV BOLUS (SEPSIS)
1000.0000 mL | Freq: Once | INTRAVENOUS | Status: AC
  Administered 2021-04-11: 1000 mL via INTRAVENOUS

## 2021-04-11 MED ORDER — FENTANYL CITRATE PF 50 MCG/ML IJ SOSY
50.0000 ug | PREFILLED_SYRINGE | Freq: Once | INTRAMUSCULAR | Status: AC
  Administered 2021-04-11: 50 ug via INTRAVENOUS
  Filled 2021-04-11: qty 1

## 2021-04-11 MED ORDER — MORPHINE SULFATE (PF) 4 MG/ML IV SOLN: 4.0000 mg | Freq: Once | INTRAVENOUS | Status: DC

## 2021-04-11 MED ORDER — SODIUM CHLORIDE 0.9 % IV SOLN
1000.0000 mL | INTRAVENOUS | Status: DC
  Administered 2021-04-11: 1000 mL via INTRAVENOUS

## 2021-04-11 MED ORDER — IOHEXOL 350 MG/ML SOLN
100.0000 mL | Freq: Once | INTRAVENOUS | Status: AC | PRN
  Administered 2021-04-11: 100 mL via INTRAVENOUS

## 2021-04-11 MED ORDER — ONDANSETRON HCL 4 MG/2ML IJ SOLN
4.0000 mg | Freq: Once | INTRAMUSCULAR | Status: AC
  Administered 2021-04-11: 4 mg via INTRAVENOUS
  Filled 2021-04-11: qty 2

## 2021-04-11 NOTE — Progress Notes (Signed)
Orthopedic Tech Progress Note Patient Details:  Joy Adams Dec 15, 1980 [id number]  Level 2 trauma   Patient ID: [name], female   DOB: [birth year], 40 y.o.   MRN: [id number]  Donald Pore 04/11/2021, 6:37 PM
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
## 2021-04-11 NOTE — Progress Notes (Signed)
   04/11/21 1730  Clinical Encounter Type  Visited With Patient not available  Visit Type Trauma  Referral From Nurse  Consult/Referral To Chaplain   Chaplain responded. The patient is being attended by RN. There is no support person present.  This note was prepared by Deneen Harts, M.Div..  For questions please contact by phone [phone].

## 2021-04-11 NOTE — Progress Notes (Signed)
[facility] encountered pt. and her family as they were being brought into ED; [facility] provided supportive presence to pt.'s two younger sons as staff admitted family.   Escorted sons to pediatric ED w/EMS.
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
## 2021-04-11 NOTE — Discharge Instructions (Addendum)
Take the medications as needed for pain.  He could also try supplementing with ibuprofen or naproxen.  Follow-up with your doctor to be rechecked if her symptoms have not improved within the next week.  Expect to be stiff and sore.  Return to the ED as needed for worsening symptoms

## 2021-04-16 DIAGNOSIS — I959 Hypotension, unspecified: Secondary | ICD-10-CM | POA: Insufficient documentation

## 2021-05-09 ENCOUNTER — Other Ambulatory Visit: Payer: Self-pay

## 2021-05-09 ENCOUNTER — Ambulatory Visit
Admission: EM | Admit: 2021-05-09 | Discharge: 2021-05-09 | Disposition: A | Discharge status: Home / Self Care | Payer: Medicare HMO | Attending: Family Medicine | Admitting: Family Medicine

## 2021-05-09 ENCOUNTER — Encounter: Payer: Self-pay | Admitting: Emergency Medicine

## 2021-05-09 DIAGNOSIS — Z7982 Long term (current) use of aspirin: Secondary | ICD-10-CM | POA: Diagnosis not present

## 2021-05-09 DIAGNOSIS — Z793 Long term (current) use of hormonal contraceptives: Secondary | ICD-10-CM | POA: Insufficient documentation

## 2021-05-09 DIAGNOSIS — Z79899 Other long term (current) drug therapy: Secondary | ICD-10-CM | POA: Diagnosis not present

## 2021-05-09 DIAGNOSIS — Z880 Allergy status to penicillin: Secondary | ICD-10-CM | POA: Insufficient documentation

## 2021-05-09 DIAGNOSIS — Z881 Allergy status to other antibiotic agents status: Secondary | ICD-10-CM | POA: Diagnosis not present

## 2021-05-09 DIAGNOSIS — Z20822 Contact with and (suspected) exposure to covid-19: Secondary | ICD-10-CM | POA: Insufficient documentation

## 2021-05-09 DIAGNOSIS — J069 Acute upper respiratory infection, unspecified: Secondary | ICD-10-CM | POA: Diagnosis present

## 2021-05-09 MED ORDER — IPRATROPIUM BROMIDE 0.06 % NA SOLN: 2.0000 | Freq: Four times a day (QID) | NASAL | 12 refills | Status: DC

## 2021-05-09 MED ORDER — PROMETHAZINE-DM 6.25-15 MG/5ML PO SYRP: 5.0000 mL | ORAL_SOLUTION | Freq: Four times a day (QID) | ORAL | 0 refills | Status: DC | PRN

## 2021-05-09 MED ORDER — BENZONATATE 100 MG PO CAPS: 200.0000 mg | ORAL_CAPSULE | Freq: Three times a day (TID) | ORAL | 0 refills | Status: DC

## 2021-05-09 NOTE — ED Triage Notes (Signed)
Pt c/o cough, nasal congestion, runny nose, shortness of breath. Started about 3 days ago. Denies fever.

## 2021-05-09 NOTE — Discharge Instructions (Addendum)
Isolate at home pending the results of your COVID test.  If you test positive then you will have to quarantine for 5 days from the start of your symptoms.  After 5 days you can break quarantine if your symptoms have improved and you have not had a fever for 24 hours without taking Tylenol or ibuprofen.  Use over-the-counter Tylenol and ibuprofen as needed for body aches and fever.  Use the Tessalon Perles during the day as needed for cough and the Promethazine DM cough syrup at nighttime as will make you drowsy.  Use the Atrovent nasal spray, 2 squirts in each nostril every 6 hours, as needed for nasal congestion and runny nose.   If you develop any increased shortness of breath-especially at rest, you are unable to speak in full sentences, or is a late sign your lips are turning blue you need to go the ER for evaluation.  

## 2021-05-09 NOTE — ED Provider Notes (Signed)
[facility]    CSN: [id number] Arrival date & time: 05/09/21  1146      History   Chief Complaint Chief Complaint  Patient presents with   Cough   Nasal Congestion    HPI Joy Adams is a 40 y.o. female.   HPI  76-year-old female here for evaluation of respiratory complaints.  Patient reports that for last 3 days she has been experiencing runny nose and nasal congestion for clear nasal discharge, scratchy sore throat, ear pain nonproductive cough, shortness of breath, and diarrhea.  She denies any fever, wheezing, nausea vomiting, or chest pain.  Past Medical History:  Diagnosis Date   Hypercholesteremia    MI (myocardial infarction) (HCC)    Migraines    Stroke (HCC)     There are no problems to display for this patient.   Past Surgical History:  Procedure Laterality Date   CARDIAC SURGERY     CHOLECYSTECTOMY      OB History   No obstetric history on file.      Home Medications    Prior to Admission medications   Medication Sig Start Date End Date Taking? Authorizing Provider  apixaban (ELIQUIS) 5 MG TABS tablet Take by mouth. 04/19/21  Yes [provider]  aspirin 325 MG tablet Take 325 mg by mouth daily.   Yes [provider]  atorvastatin (LIPITOR) 40 MG tablet Take 40 mg by mouth daily.   Yes [provider]  benzonatate (TESSALON) 100 MG capsule Take 2 capsules (200 mg total) by mouth every 8 (eight) hours. 05/09/21  Yes Becky Augusta, NP  butalbital-acetaminophen-caffeine (FIORICET, ESGIC) [phone] MG per tablet Take 1 tablet by mouth 2 (two) times daily as needed for headache.   Yes [provider]  cetirizine (ZYRTEC) 10 MG tablet Take 1 tablet (10 mg total) by mouth daily. 09/17/19  Yes Candis Schatz, PA-C  escitalopram (LEXAPRO) 10 MG tablet Take 3 tablets by mouth daily. 09/17/19  Yes [provider]  fluticasone (FLONASE) 50 MCG/ACT nasal spray Place 1 spray into both nostrils daily.  04/02/18  Yes Law, Waylan Boga, PA-C  furosemide (LASIX) 20 MG tablet Take 1 tablet by mouth daily as needed.   Yes [provider]  gabapentin (NEURONTIN) 600 MG tablet Take 600 mg by mouth 3 (three) times daily.   Yes [provider]  ipratropium (ATROVENT) 0.06 % nasal spray Place 2 sprays into both nostrils 4 (four) times daily. 05/09/21  Yes Becky Augusta, NP  levonorgestrel (MIRENA) 20 MCG/24HR IUD 1 each by Intrauterine route once.   Yes [provider]  magnesium oxide (MAG-OX) 400 (241.3 Mg) MG tablet Take 2 tablets by mouth at bedtime. 07/29/19  Yes [provider]  pantoprazole (PROTONIX) 20 MG tablet Take 20 mg by mouth daily. 08/06/19  Yes [provider]  promethazine-dextromethorphan (PROMETHAZINE-DM) 6.25-15 MG/5ML syrup Take 5 mLs by mouth 4 (four) times daily as needed. 05/09/21  Yes Becky Augusta, NP  aspirin 81 MG EC tablet Take by mouth.    [provider]  nitroGLYCERIN (NITROSTAT) 0.4 MG SL tablet Place 0.4 mg under the tongue every 5 (five) minutes as needed for chest pain.    [provider]  triamcinolone (KENALOG) 0.1 % Apply 1 application topically 2 (two) times daily. 07/11/20   Becky Augusta, NP  TROKENDI XR 200 MG CP24 Take 1 capsule by mouth at bedtime. 08/22/19   [provider]  losartan (COZAAR) 25 MG tablet Take 25  mg by mouth daily.  09/17/19  [provider]    Family History Family History  Problem Relation Age of Onset   Other Mother        unknown medical history   Heart attack Father     Social History Social History   Tobacco Use   Smoking status: Never   Smokeless tobacco: Never  Vaping Use   Vaping Use: Never used  Substance Use Topics   Alcohol use: No   Drug use: No     Allergies   Amoxicillin, Penicillins, Cefdinir, Augmentin [amoxicillin-pot clavulanate], and Limonene   Review of Systems Review of Systems  Constitutional:  Negative for activity change,  appetite change and fever.  HENT:  Positive for congestion, ear pain, postnasal drip, rhinorrhea and sore throat. Negative for ear discharge.   Respiratory:  Positive for cough and shortness of breath. Negative for wheezing.   Cardiovascular:  Negative for chest pain.  Gastrointestinal:  Positive for diarrhea. Negative for nausea and vomiting.  Skin:  Negative for rash.  Hematological: Negative.   Psychiatric/Behavioral: Negative.      Physical Exam Triage Vital Signs ED Triage Vitals  Enc Vitals Group     BP 05/09/21 1307 110/73     Pulse Rate 05/09/21 1307 77     Resp 05/09/21 1307 18     Temp 05/09/21 1307 98.1 F (36.7 C)     Temp Source 05/09/21 1307 Oral     SpO2 05/09/21 1307 100 %     Weight 05/09/21 1302 231 lb 0.7 oz (104.8 kg)     Height 05/09/21 1302 5\' 4"  (1.626 m)     Head Circumference --      Peak Flow --      Pain Score 05/09/21 1301 4     Pain Loc --      Pain Edu? --      Excl. in GC? --    No data found.  Updated Vital Signs BP 110/73 (BP Location: Left Arm)   Pulse 77   Temp 98.1 F (36.7 C) (Oral)   Resp 18   Ht 5\' 4"  (1.626 m)   Wt 231 lb 0.7 oz (104.8 kg)   SpO2 100%   BMI 39.66 kg/m   Visual Acuity Right Eye Distance:   Left Eye Distance:   Bilateral Distance:    Right Eye Near:   Left Eye Near:    Bilateral Near:     Physical Exam Vitals and nursing note reviewed.  Constitutional:      General: She is not in acute distress.    Appearance: Normal appearance. She is ill-appearing.  HENT:     Head: Normocephalic and atraumatic.     Right Ear: Tympanic membrane, ear canal and external ear normal. There is no impacted cerumen.     Left Ear: Tympanic membrane, ear canal and external ear normal. There is no impacted cerumen.     Nose: Congestion and rhinorrhea present.     Mouth/Throat:     Mouth: Mucous membranes are moist.     Pharynx: Posterior oropharyngeal erythema present.  Cardiovascular:     Rate and Rhythm: Normal rate and  regular rhythm.     Pulses: Normal pulses.     Heart sounds: Normal heart sounds. No murmur heard.   No gallop.  Pulmonary:     Effort: Pulmonary effort is normal.     Breath sounds: Normal breath sounds. No wheezing, rhonchi or rales.  Musculoskeletal:  Cervical back: Normal range of motion and neck supple.  Lymphadenopathy:     Cervical: No cervical adenopathy.  Skin:    General: Skin is warm and dry.     Capillary Refill: Capillary refill takes less than 2 seconds.     Findings: No erythema or rash.  Neurological:     General: No focal deficit present.     Mental Status: She is alert and oriented to person, place, and time.  Psychiatric:        Mood and Affect: Mood normal.        Behavior: Behavior normal.        Thought Content: Thought content normal.        Judgment: Judgment normal.     UC Treatments / Results  Labs (all labs ordered are listed, but only abnormal results are displayed) Labs Reviewed  SARS CORONAVIRUS 2 (TAT 6-24 HRS)    EKG   Radiology No results found.  Procedures Procedures (including critical care time)  Medications Ordered in UC Medications - No data to display  Initial Impression / Assessment and Plan / UC Course  I have reviewed the triage vital signs and the nursing notes.  Pertinent labs & imaging results that were available during my care of the patient were reviewed by me and considered in my medical decision making (see chart for details).  Patient is a very pleasant though ill-appearing 40 year old female here for evaluation of respiratory complaints of been going on for the past 3 days as outlined in HPI above.  Patient's physical exam reveals mild erythema to both tympanic membranes without effusion, injection, or bulging.  Both external auditory canals are clear.  Nasal mucosa is erythematous and edematous with clear nasal discharge.  Oropharyngeal exam reveals clear postnasal drip but no significant erythema.  No cervical  lymphadenopathy appreciated exam.  Cardiopulmonary exam reveals clear lung sounds all fields.  Patient exam is consistent with a viral URI with a cough.  Will swab patient for COVID and discharged home with Atrovent nasal spray, Tessalon Perles, Promethazine DM cough syrup.   Final Clinical Impressions(s) / UC Diagnoses   Final diagnoses:  Viral URI with cough     Discharge Instructions      Isolate at home pending the results of your COVID test.  If you test positive then you will have to quarantine for 5 days from the start of your symptoms.  After 5 days you can break quarantine if your symptoms have improved and you have not had a fever for 24 hours without taking Tylenol or ibuprofen.  Use over-the-counter Tylenol and ibuprofen as needed for body aches and fever.  Use the Tessalon Perles during the day as needed for cough and the Promethazine DM cough syrup at nighttime as will make you drowsy.  Use the Atrovent nasal spray, 2 squirts in each nostril every 6 hours, as needed for nasal congestion and runny nose.   If you develop any increased shortness of breath-especially at rest, you are unable to speak in full sentences, or is a late sign your lips are turning blue you need to go the ER for evaluation.      ED Prescriptions     Medication Sig Dispense Auth. Provider   benzonatate (TESSALON) 100 MG capsule Take 2 capsules (200 mg total) by mouth every 8 (eight) hours. 21 capsule Becky Augusta, NP   ipratropium (ATROVENT) 0.06 % nasal spray Place 2 sprays into both nostrils 4 (four) times daily. 15  mL Becky Augusta, NP   promethazine-dextromethorphan (PROMETHAZINE-DM) 6.25-15 MG/5ML syrup Take 5 mLs by mouth 4 (four) times daily as needed. 118 mL Becky Augusta, NP      PDMP not reviewed this encounter.   Becky Augusta, NP 05/09/21 1334

## 2021-05-10 LAB — SARS CORONAVIRUS 2 (TAT 6-24 HRS): SARS Coronavirus 2: NEGATIVE

## 2021-08-17 ENCOUNTER — Encounter: Payer: Self-pay | Admitting: Gastroenterology

## 2021-08-17 ENCOUNTER — Other Ambulatory Visit: Payer: Self-pay

## 2021-08-17 ENCOUNTER — Encounter: Payer: Self-pay | Admitting: Anesthesiology

## 2021-08-17 ENCOUNTER — Ambulatory Visit (INDEPENDENT_AMBULATORY_CARE_PROVIDER_SITE_OTHER): Payer: Medicare Other | Admitting: Gastroenterology

## 2021-08-17 VITALS — BP 104/70 | HR 85 | Temp 98.5°F | Ht 64.0 in | Wt 221.0 lb

## 2021-08-17 DIAGNOSIS — Z8371 Family history of colonic polyps: Secondary | ICD-10-CM

## 2021-08-17 DIAGNOSIS — K5904 Chronic idiopathic constipation: Secondary | ICD-10-CM

## 2021-08-17 DIAGNOSIS — R1031 Right lower quadrant pain: Secondary | ICD-10-CM | POA: Diagnosis not present

## 2021-08-17 MED ORDER — NA SULFATE-K SULFATE-MG SULF 17.5-3.13-1.6 GM/177ML PO SOLN: 1.0000 | Freq: Once | ORAL | 0 refills | Status: AC
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
## 2021-08-24 NOTE — Congregational Nurse Program (Signed)
°  Dept: [phone]   Congregational Nurse Program Note  Date of Encounter: 08/24/2021  Past Medical History: Past Medical History:  Diagnosis Date   GERD (gastroesophageal reflux disease)    Hypercholesteremia    MI (myocardial infarction) (HCC) 2014   Migraines    "All the time"   Stroke [facility]) 2012   Some memory issues.  Some right sided weakness.   Wears dentures    Has partial upper and lower.  Doesn't wear.    Encounter Details:  CNP Questionnaire - 08/24/21 1619       Questionnaire   Do you give verbal consent to treat you today? Yes    Location Patient Served  Not Applicable    Visit Setting Church or Organization    Patient Status Unknown    Insurance Medicaid;Medicare    Insurance Referral N/A    Medication N/A    Medical Provider Yes    Screening Referrals N/A    Medical Referral Non-[facility] PCP/Clinic    Food Have Food Insecurities    Transportation Need transportation assistance    Housing/Utilities N/A    Interpersonal Safety N/A    Intervention Blood pressure;Educate    ED Visit Averted N/A    Life-Saving Intervention Made N/A            Client into nurse only clinic at [facility] requesting BP screening and flu shot. States she has a history of stroke 11 years ago and heart attack 8 years ago. Doesn't drive but has a friend who drives her as needed. Sees MD monthly. Allergic to Amoxicillin/PCN. BP 140/90. Co re importance to follow up with BP checks as it is slightly elevated, limit salt intake and to increase water intake. Flu shot administrated. RTC prn for BP monitoring. Rhermann, RN

## 2021-09-09 ENCOUNTER — Telehealth: Payer: Self-pay

## 2021-09-09 NOTE — Telephone Encounter (Signed)
Patient contacted [facility] to inform them she did not have paperwork nor the bowel prep. Informed patient prep was sent to pharmacy on 01/11 to [facility] and instructions are located in my chart. Spoke with Shawna Orleans and she informed patient was given paperwork in office. Will contact pharmacy to refill prep. Pt verbalized understanding.

## 2021-09-14 ENCOUNTER — Telehealth: Payer: Self-pay

## 2021-09-14 NOTE — Telephone Encounter (Signed)
I have faxed blood thinner clearance numerous times... I have also called the office 2 times and the phone just rang with no voicemail or menu prompt.Marland KitchenMarland KitchenMarland Kitchen

## 2021-09-19 ENCOUNTER — Ambulatory Visit: Admission: RE | Admit: 2021-09-19 | Payer: Medicare Other | Source: Home / Self Care | Admitting: Gastroenterology

## 2021-09-19 HISTORY — DX: Gastro-esophageal reflux disease without esophagitis: K21.9

## 2021-09-19 HISTORY — DX: Presence of dental prosthetic device (complete) (partial): Z97.2

## 2021-09-19 SURGERY — COLONOSCOPY WITH PROPOFOL
Anesthesia: Choice

## 2021-10-19 NOTE — Congregational Nurse Program (Signed)
  Dept: [phone]   Congregational Nurse Program Note  Date of Encounter: 08/31/2021  Past Medical History: Past Medical History:  Diagnosis Date   GERD (gastroesophageal reflux disease)    Hypercholesteremia    MI (myocardial infarction) (HCC) 2014   Migraines    "All the time"   Stroke [facility]) 2012   Some memory issues.  Some right sided weakness.   Wears dentures    Has partial upper and lower.  Doesn't wear.    Encounter Details:

## 2022-04-02 ENCOUNTER — Encounter: Payer: Self-pay | Admitting: Emergency Medicine

## 2022-04-02 ENCOUNTER — Emergency Department: Payer: Medicare Other

## 2022-04-02 ENCOUNTER — Other Ambulatory Visit: Payer: Self-pay

## 2022-04-02 ENCOUNTER — Emergency Department
Admission: EM | Admit: 2022-04-02 | Discharge: 2022-04-02 | Disposition: A | Discharge status: Home / Self Care | Payer: Medicare Other | Attending: Emergency Medicine | Admitting: Emergency Medicine

## 2022-04-02 DIAGNOSIS — R0602 Shortness of breath: Secondary | ICD-10-CM | POA: Insufficient documentation

## 2022-04-02 DIAGNOSIS — R0789 Other chest pain: Secondary | ICD-10-CM | POA: Diagnosis present

## 2022-04-02 LAB — BASIC METABOLIC PANEL
Anion gap: 8 (ref 5–15)
BUN: 7 mg/dL (ref 6–20)
CO2: 22 mmol/L (ref 22–32)
Calcium: 8.4 mg/dL — ABNORMAL LOW (ref 8.9–10.3)
Chloride: 112 mmol/L — ABNORMAL HIGH (ref 98–111)
Creatinine, Ser: 0.65 mg/dL (ref 0.44–1.00)
GFR, Estimated: >60 mL/min (ref >60)
Glucose, Bld: 92 mg/dL (ref 70–99)
Potassium: 3.5 mmol/L (ref 3.5–5.1)
Sodium: 142 mmol/L (ref 135–145)

## 2022-04-02 LAB — CBC
HCT: 45.9 % (ref 36.0–46.0)
Hemoglobin: 14.8 g/dL (ref 12.0–15.0)
MCH: 30.7 pg (ref 26.0–34.0)
MCHC: 32.2 g/dL (ref 30.0–36.0)
MCV: 95.2 fL (ref 80.0–100.0)
Platelets: 286 10*3/uL (ref 150–400)
RBC: 4.82 MIL/uL (ref 3.87–5.11)
RDW: 12.1 % (ref 11.5–15.5)
WBC: 10.9 10*3/uL — ABNORMAL HIGH (ref 4.0–10.5)
nRBC: 0 % (ref 0.0–0.2)

## 2022-04-02 LAB — TROPONIN I (HIGH SENSITIVITY)
Troponin I (High Sensitivity): 3 ng/L (ref <18)
Troponin I (High Sensitivity): 3 ng/L (ref <18)

## 2022-04-02 NOTE — ED Provider Notes (Signed)
[facility] Provider Note  Patient Contact: 8:11 PM (approximate)   History   Chest Pain and Shortness of Breath   HPI  Joy Adams is a 41 y.o. female who presents to the emergency department complaining of right-sided chest pain.  Patient states that she woke up this morning, had no symptoms, was doing her normal morning routine.  She states that she was bent over pushing on to the recliner that it got stuck trying to lower the legs when she had a sharp pain in the right chest.  She states that the pain made her sit down.  She was not short of breath.  Patient states that the pain is still present along the right chest region.  There is no associated shortness of breath.  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  She states that her cardiologist is at [facility].  She believes that she has had an echo and a stress test within the last 3 years.  Patient denies any GI symptoms, URI symptoms.  She took aspirin and 0.4 of nitro with improvement in her pain.     Physical Exam   Triage Vital Signs: ED Triage Vitals  Enc Vitals Group     BP 04/02/22 1324 118/85     Pulse Rate 04/02/22 1324 82     Resp 04/02/22 1324 20     Temp 04/02/22 1324 98.2 F (36.8 C)     Temp Source 04/02/22 1324 Oral     SpO2 04/02/22 1324 96 %     Weight 04/02/22 1315 220 lb 7.4 oz (100 kg)     Height 04/02/22 1315 5\' 4"  (1.626 m)     Head Circumference --      Peak Flow --      Pain Score 04/02/22 1315 6     Pain Loc --      Pain Edu? --      Excl. in [place]? --     Most recent vital signs: Vitals:   04/02/22 1324 04/02/22 1841  BP: 118/85 131/78  Pulse: 82 75  Resp: 20 20  Temp: 98.2 F (36.8 C) 98.2 F (36.8 C)  SpO2: 96% 100%     General: Alert and in no acute distress.   Cardiovascular:  Good peripheral perfusion.  Normal S1 and S2 with no appreciable murmurs, rubs, gallops.  Good peripheral perfusion Respiratory: Normal respiratory effort  without tachypnea or retractions. Lungs CTAB. Good air entry to the bases with no decreased or absent breath sounds. Musculoskeletal: Full range of motion to all extremities.  Slight tenderness along the right chest wall with palpation.  This does reproduce patient's symptoms.  No palpable abnormalities or crepitus. Neurologic:  No gross focal neurologic deficits are appreciated.  Skin:   No rash noted Other:   ED Results / Procedures / Treatments   Labs (all labs ordered are listed, but only abnormal results are displayed) Labs Reviewed  BASIC METABOLIC PANEL - Abnormal; Notable for the following components:      Result Value   Chloride 112 (*)    Calcium 8.4 (*)    All other components within normal limits  CBC - Abnormal; Notable for the following components:   WBC 10.9 (*)    All other components within normal limits  TROPONIN I (HIGH SENSITIVITY)  TROPONIN I (HIGH SENSITIVITY)     EKG  ED ECG REPORT I, 04/04/22 Humna Moorehouse,  personally  viewed and interpreted this ECG.   Date: 04/02/2022  EKG Time: 1316 hrs.  Rate: 89 bpm  Rhythm: unchanged from previous tracings, normal sinus rhythm, compared to previous EKG from 04/12/2021 no significant changes  Axis: Normal axis  Intervals:none  ST&T Change: No gross ST elevation or depression noted.  No STEMI   Normal sinus rhythm.  No STEMI.  No significant change from previous EKG from 04/12/2021    RADIOLOGY  I personally viewed, evaluated, and interpreted these images as part of my medical decision making, as well as reviewing the written report by the radiologist.  ED Provider Interpretation: No acute cardiopulmonary findings seen on chest x-ray  DG Chest 2 View  Result Date: 04/02/2022 CLINICAL DATA:  Acute chest pain EXAM: CHEST - 2 VIEW COMPARISON:  04/11/2021 and prior studies FINDINGS: UPPER limits normal heart size again noted. There is no evidence of focal airspace disease, pulmonary edema, suspicious pulmonary  nodule/mass, pleural effusion, or pneumothorax. No acute bony abnormalities are identified. IMPRESSION: No active cardiopulmonary disease. Electronically Signed   By: Harmon Pier M.D.   On: 04/02/2022 13:54    PROCEDURES:  Critical Care performed: No  Procedures   MEDICATIONS ORDERED IN ED: Medications - No data to display   IMPRESSION / MDM / ASSESSMENT AND PLAN / ED COURSE  I reviewed the triage vital signs and the nursing notes.                              Differential diagnosis includes, but is not limited to, STEMI, NSTEMI, nonspecific chest pain, rib fracture, pneumothorax, bronchitis, pneumonia  The patient is on the cardiac monitor to evaluate for evidence of arrhythmia and/or significant heart rate changes.  Patient's presentation is most consistent with acute presentation with potential threat to life or bodily function.   Patient's diagnosis is consistent with nonspecific chest pain.  Patient presented for right-sided chest pain.  She states that she was bent over trying to push the legs of a recliner back when she started to experience right-sided chest pain.  Improved slightly with aspirin and nitro prior to arrival.  She does have a history of an MI and 2015.  Patient stated that she believed that she had had a stress test and an echocardiogram within the last 3 years.  It appears that patient has had an echocardiogram on 05/17/2021 we will need to with review of medical records.  Patient has normal left ventricular EF though she does have a persistent segmental hypokinesis which is consistent with her previous echocardiograms.  I was unable to see any records regarding a stress test however.  Patient's labs, chest x-ray and EKG are reassuring here.  Troponins are both within normal limits.  She did have some tenderness on physical exam to the right chest wall reproducing the patient's symptoms.  I suspect a component of musculoskeletal.  Given her cardiac history I do  recommend following up with her cardiologist.  Return precautions discussed with the patient.  At this time patient appears stable for discharge..  Patient is given ED precautions to return to the ED for any worsening or new symptoms.        FINAL CLINICAL IMPRESSION(S) / ED DIAGNOSES   Final diagnoses:  Atypical chest pain     Rx / DC Orders   ED Discharge Orders     None        Note:  This document was prepared  using Conservation officer, historic buildings and may include unintentional dictation errors.   Lanette Hampshire 04/02/22 2020    Pilar Jarvis, MD 04/02/22 2056

## 2022-04-02 NOTE — ED Triage Notes (Signed)
Pt in via EMS from home with c/o CP for an hour., sudden onset, 10/10 pain, Pt took 324mg  of asa, 0.4 nitro and pain decreased to 7/10

## 2022-04-02 NOTE — ED Triage Notes (Signed)
Pt reports CP to mid chest radiating into right arm for the last hour. Pt reports took 324mg  of baby asa and a nitro with slight relief.

## 2022-04-02 NOTE — ED Provider Triage Note (Signed)
Emergency Medicine Provider Triage Evaluation Note  Joy Adams , a 41 y.o. female  was evaluated in triage.  Pt complains of chest pain, radiates to the right side of her chest and into her arm.  Patient was pushing on a recliner when the pain started and shortness of breath.  Review of Systems  Positive: Chest pain, right shoulder pain Negative: Fever chills  Physical Exam  Ht 5\' 4"  (1.626 m)   Wt 100 kg   BMI 37.84 kg/m  Gen:   Awake, no distress   Resp:  Normal effort  MSK:   Right shoulder tender to palpation, trapezius tender to palpation, Other:    Medical Decision Making  Medically screening exam initiated at 1:20 PM.  Appropriate orders placed.  was informed that the remainder of the evaluation will be completed by another provider, this initial triage assessment does not replace that evaluation, and the importance of remaining in the ED until their evaluation is complete.  X-ray of the right shoulder, cardiac work-up started   [name], PA-C 04/02/22 1321

## 2022-04-28 DIAGNOSIS — Z9861 Coronary angioplasty status: Secondary | ICD-10-CM | POA: Insufficient documentation

## 2022-05-03 ENCOUNTER — Emergency Department
Admission: EM | Admit: 2022-05-03 | Discharge: 2022-05-04 | Disposition: A | Discharge status: Home / Self Care | Payer: Medicare Other | Attending: Emergency Medicine | Admitting: Emergency Medicine

## 2022-05-03 ENCOUNTER — Other Ambulatory Visit: Payer: Self-pay

## 2022-05-03 ENCOUNTER — Encounter: Payer: Self-pay | Admitting: Emergency Medicine

## 2022-05-03 DIAGNOSIS — N390 Urinary tract infection, site not specified: Secondary | ICD-10-CM | POA: Insufficient documentation

## 2022-05-03 DIAGNOSIS — R1030 Lower abdominal pain, unspecified: Secondary | ICD-10-CM | POA: Diagnosis present

## 2022-05-03 LAB — CBC
HCT: 43.7 % (ref 36.0–46.0)
Hemoglobin: 14.4 g/dL (ref 12.0–15.0)
MCH: 30.7 pg (ref 26.0–34.0)
MCHC: 33 g/dL (ref 30.0–36.0)
MCV: 93.2 fL (ref 80.0–100.0)
Platelets: 365 10*3/uL (ref 150–400)
RBC: 4.69 MIL/uL (ref 3.87–5.11)
RDW: 12 % (ref 11.5–15.5)
WBC: 8.6 10*3/uL (ref 4.0–10.5)
nRBC: 0 % (ref 0.0–0.2)

## 2022-05-03 LAB — URINALYSIS, ROUTINE W REFLEX MICROSCOPIC
Bilirubin Urine: NEGATIVE
Glucose, UA: NEGATIVE mg/dL
Hgb urine dipstick: NEGATIVE
Ketones, ur: NEGATIVE mg/dL
Nitrite: NEGATIVE
Protein, ur: 30 mg/dL — AB
Specific Gravity, Urine: 1.02 (ref 1.005–1.030)
pH: 7 (ref 5.0–8.0)

## 2022-05-03 LAB — COMPREHENSIVE METABOLIC PANEL
ALT: 28 U/L (ref 0–44)
AST: 26 U/L (ref 15–41)
Albumin: 4.1 g/dL (ref 3.5–5.0)
Alkaline Phosphatase: 140 U/L — ABNORMAL HIGH (ref 38–126)
Anion gap: 7 (ref 5–15)
BUN: 9 mg/dL (ref 6–20)
CO2: 24 mmol/L (ref 22–32)
Calcium: 8.9 mg/dL (ref 8.9–10.3)
Chloride: 110 mmol/L (ref 98–111)
Creatinine, Ser: 0.76 mg/dL (ref 0.44–1.00)
GFR, Estimated: >60 mL/min (ref >60)
Glucose, Bld: 133 mg/dL — ABNORMAL HIGH (ref 70–99)
Potassium: 3.4 mmol/L — ABNORMAL LOW (ref 3.5–5.1)
Sodium: 141 mmol/L (ref 135–145)
Total Bilirubin: 0.8 mg/dL (ref 0.3–1.2)
Total Protein: 7.5 g/dL (ref 6.5–8.1)

## 2022-05-03 LAB — POC URINE PREG, ED: Preg Test, Ur: NEGATIVE

## 2022-05-03 LAB — LIPASE, BLOOD: Lipase: 39 U/L (ref 11–51)

## 2022-05-03 MED ORDER — DIPHENHYDRAMINE HCL 25 MG PO CAPS: 50.0000 mg | ORAL_CAPSULE | Freq: Once | ORAL | Status: AC

## 2022-05-03 MED ORDER — DIPHENHYDRAMINE HCL 50 MG/ML IJ SOLN: 50.0000 mg | Freq: Once | INTRAMUSCULAR | Status: DC

## 2022-05-03 MED ORDER — METHYLPREDNISOLONE SODIUM SUCC 40 MG IJ SOLR
40.0000 mg | Freq: Once | INTRAMUSCULAR | Status: AC
Start: 2022-05-03 — End: 2022-05-03
  Administered 2022-05-03: 40 mg via INTRAVENOUS
  Filled 2022-05-03: qty 1

## 2022-05-03 MED ORDER — DIPHENHYDRAMINE HCL 50 MG/ML IJ SOLN
50.0000 mg | Freq: Once | INTRAMUSCULAR | Status: AC
  Administered 2022-05-04: 50 mg via INTRAVENOUS
  Filled 2022-05-03: qty 1

## 2022-05-03 NOTE — ED Triage Notes (Signed)
Pt via POV from home. Pt c/o lower abd pain into her groin. Denies any vomiting but states she is nauseous. States she does have some dysuria. Pt has a hx of cholecystectomy. Pt is A&Ox4 and NAD

## 2022-05-03 NOTE — ED Provider Notes (Signed)
[facility] Provider Note    Event Date/Time   First MD Initiated Contact with Patient 05/03/22 2258     (approximate)   History   Abdominal Pain   HPI  Joy Adams is a 41 y.o. female   Past medical history of cholecystectomy, GERD, hypercholesterolemia, stroke and MI who presents with lower abdominal pain for the past 2 days.  No fever or chills.  Denies dysuria.  No vaginal discharge or abnormal vaginal bleeding  She states that she had been constipated for weeks prior and then started on a new bowel regimen by her doctor earlier this week.  After taking the bowel regiment, her constipation resolved and she has been having more frequent and loose stools, around the onset of her abdominal pain complaints as well.   History was obtained via patient and review of external medical notes      Physical Exam   Triage Vital Signs: ED Triage Vitals [05/03/22 1856]  Enc Vitals Group     BP 119/64     Pulse Rate 81     Resp 18     Temp 98.2 F (36.8 C)     Temp Source Oral     SpO2 97 %     Weight (!) 317 lb (143.8 kg)     Height 5\' 4"  (1.626 m)     Head Circumference      Peak Flow      Pain Score 6     Pain Loc      Pain Edu?      Excl. in [place]?     Most recent vital signs: Vitals:   05/04/22 0230 05/04/22 0310  BP: 112/69   Pulse: 69 81  Resp: 18   Temp:    SpO2: 94% 97%    General: Awake, no distress.  CV:  Good peripheral perfusion.  Resp:  Normal effort.  Clear Abd:  No distention.  Mild discomfort to palpation on the right greater than left lower quadrants.  Also suprapubic tenderness.    ED Results / Procedures / Treatments   Labs (all labs ordered are listed, but only abnormal results are displayed) Labs Reviewed  COMPREHENSIVE METABOLIC PANEL - Abnormal; Notable for the following components:      Result Value   Potassium 3.4 (*)    Glucose, Bld 133 (*)    Alkaline Phosphatase 140 (*)    All other components  within normal limits  URINALYSIS, ROUTINE W REFLEX MICROSCOPIC - Abnormal; Notable for the following components:   Color, Urine YELLOW (*)    APPearance TURBID (*)    Protein, ur 30 (*)    Leukocytes,Ua MODERATE (*)    Bacteria, UA RARE (*)    All other components within normal limits  URINE CULTURE  LIPASE, BLOOD  CBC  POC URINE PREG, ED     I reviewed labs and they are notable for urinalysis with rare bacteria and moderate leukocytes   RADIOLOGY I independently reviewed and interpreted CT a/p and no obvious inflammatory changes or obstructive pattern   PROCEDURES:  Critical Care performed: No  Procedures   MEDICATIONS ORDERED IN ED: Medications  sulfamethoxazole-trimethoprim (BACTRIM DS) 800-160 MG per tablet 1 tablet (has no administration in time range)  methylPREDNISolone sodium succinate (SOLU-MEDROL) 40 mg/mL injection 40 mg (40 mg Intravenous Given 05/03/22 2339)  diphenhydrAMINE (BENADRYL) capsule 50 mg ( Oral See Alternative 05/04/22 0259)    Or  diphenhydrAMINE (BENADRYL) injection 50 mg (50  mg Intravenous Given 05/04/22 0259)  iohexol (OMNIPAQUE) 350 MG/ML injection 100 mL (100 mLs Intravenous Contrast Given 05/04/22 0335)    IMPRESSION / MDM / ASSESSMENT AND PLAN / ED COURSE  I reviewed the triage vital signs and the nursing notes.                              Differential diagnosis includes, but is not limited to, appendicitis, urinary tract infection, diverticulitis and will evaluate these potential etiologies with a CT scan of the abdomen pelvis with contrast allergy premedications.    However, I think this may be due to her recent bowel regimen and loose stools associated with that.  Since she does have right lower quadrant tenderness and left lower quadrant tenderness on exam, will obtain CT scan to evaluate for diverticulitis or appendicitis.  CT scan negative for intra-abdominal acute emergent pathologies.  Urinalysis consistent with urinary tract  infection.   Dispo: After careful consideration of this patient's presentation, medical and social risk factors, and evaluation in the emergency department I engaged in shared decision making with the patient and/or their representative to consider admission or observation and this patient was ultimately discharged because able condition with negative work-up as above, urinalysis consistent with urinary tract infection culture sent and antibiotics started in the emergency department with outpatient antibiotic prescription given to patient, follow-up with PMD and return with any worsening.   Patient's presentation is most consistent with acute presentation with potential threat to life or bodily function.       FINAL CLINICAL IMPRESSION(S) / ED DIAGNOSES   Final diagnoses:  Lower urinary tract infectious disease     Rx / DC Orders   ED Discharge Orders          Ordered    sulfamethoxazole-trimethoprim (BACTRIM DS) 800-160 MG tablet  2 times daily        05/04/22 0409             Note:  This document was prepared using Dragon voice recognition software and may include unintentional dictation errors.    Pilar Jarvis, MD 05/04/22 [phone]

## 2022-05-03 NOTE — ED Provider Triage Note (Signed)
Emergency Medicine Provider Triage Evaluation Note  Joy Adams, a 41 y.o. female  was evaluated in triage.  Pt complains of abdominal pain and loose stools.  She denies vomiting but does endorse nausea and dysuria.  She denies any fevers, chills, or sweats.  Review of Systems  Positive: Lower abdominal pain Negative: CS  Physical Exam  BP 119/64   Pulse 81   Temp 98.2 F (36.8 C) (Oral)   Resp 18   Ht 5\' 4"  (1.626 m)   Wt (!) 143.8 kg   SpO2 97%   BMI 54.41 kg/m  Gen:   Awake, no distress  NAD Resp:  Normal effort CTA MSK:   Moves extremities without difficulty  ABD:  Soft, nontender  Medical Decision Making  Medically screening exam initiated at 7:01 PM.  Appropriate orders placed.  Donaciano Eva was informed that the remainder of the evaluation will be completed by another provider, this initial triage assessment does not replace that evaluation, and the importance of remaining in the ED until their evaluation is complete.  Patient to the ED for evaluation of lower abdominal pain with associated nausea without vomiting patient reports onset of symptoms yesterday.  She denies any fevers, chills, or sweats.   Melvenia Needles, PA-C 05/03/22 1903

## 2022-05-04 ENCOUNTER — Emergency Department: Payer: Medicare Other

## 2022-05-04 DIAGNOSIS — N390 Urinary tract infection, site not specified: Secondary | ICD-10-CM | POA: Diagnosis not present

## 2022-05-04 MED ORDER — SULFAMETHOXAZOLE-TRIMETHOPRIM 800-160 MG PO TABS
1.0000 | ORAL_TABLET | Freq: Once | ORAL | Status: AC
  Administered 2022-05-04: 1 via ORAL
  Filled 2022-05-04: qty 1

## 2022-05-04 MED ORDER — SULFAMETHOXAZOLE-TRIMETHOPRIM 800-160 MG PO TABS: 1.0000 | ORAL_TABLET | Freq: Two times a day (BID) | ORAL | 0 refills | Status: AC

## 2022-05-04 MED ORDER — IOHEXOL 350 MG/ML SOLN
100.0000 mL | Freq: Once | INTRAVENOUS | Status: AC | PRN
Start: 2022-05-04 — End: 2022-05-04
  Administered 2022-05-04: 100 mL via INTRAVENOUS

## 2022-05-04 NOTE — Discharge Instructions (Signed)
Take antibiotics as prescribed.   Thank you for choosing us for your health care today!  Please see your primary doctor this week for a follow up appointment.   If you do not have a primary doctor call the following clinics to establish care:  If you have insurance:  [facility] [phone] [address] [place] [address]   [facility]  [phone] [address] [place] [address]   If you do not have insurance:  [facility]  [phone] [address] [place] [address]  Sometimes, in the early stages of certain disease courses it is difficult to detect in the emergency department evaluation -- so, it is important that you continue to monitor your symptoms and call your doctor right away or return to the emergency department if you develop any new or worsening symptoms.  It was my pleasure to care for you today.   Jaze Rodino S. Random Dobrowski, MD  

## 2022-05-05 LAB — URINE CULTURE

## 2022-07-23 ENCOUNTER — Telehealth: Payer: Medicare Other | Admitting: Family Medicine

## 2022-07-23 DIAGNOSIS — U071 COVID-19: Secondary | ICD-10-CM | POA: Diagnosis not present

## 2022-07-23 MED ORDER — BENZONATATE 200 MG PO CAPS: 200.0000 mg | ORAL_CAPSULE | Freq: Two times a day (BID) | ORAL | 0 refills | Status: DC | PRN

## 2022-07-23 MED ORDER — PREDNISONE 20 MG PO TABS: 20.0000 mg | ORAL_TABLET | Freq: Two times a day (BID) | ORAL | 0 refills | Status: AC

## 2022-07-23 MED ORDER — NIRMATRELVIR/RITONAVIR (PAXLOVID)TABLET: 3.0000 | ORAL_TABLET | Freq: Two times a day (BID) | ORAL | 0 refills | Status: AC

## 2022-07-23 NOTE — Progress Notes (Signed)
Virtual Visit Consent   Joy Adams, you are scheduled for a virtual visit with a [facility] provider today. Just as with appointments in the office, your consent must be obtained to participate. Your consent will be active for this visit and any virtual visit you may have with one of our providers in the next 365 days. If you have a MyChart account, a copy of this consent can be sent to you electronically.  As this is a virtual visit, video technology does not allow for your provider to perform a traditional examination. This may limit your provider's ability to fully assess your condition. If your provider identifies any concerns that need to be evaluated in person or the need to arrange testing (such as labs, EKG, etc.), we will make arrangements to do so. Although advances in technology are sophisticated, we cannot ensure that it will always work on either your end or our end. If the connection with a video visit is poor, the visit may have to be switched to a telephone visit. With either a video or telephone visit, we are not always able to ensure that we have a secure connection.  By engaging in this virtual visit, you consent to the provision of healthcare and authorize for your insurance to be billed (if applicable) for the services provided during this visit. Depending on your insurance coverage, you may receive a charge related to this service.  I need to obtain your verbal consent now. Are you willing to proceed with your visit today? Joy Adams has provided verbal consent on 07/23/2022 for a virtual visit (video or telephone). Georgana Curio, FNP  Date: 07/23/2022 11:45 AM  Virtual Visit via Video Note   I, Georgana Curio, connected with  Joy Adams  ([id number], [birth year]) on 07/23/22 at 11:45 AM EST by a video-enabled telemedicine application and verified that I am speaking with the correct person using two identifiers.  Location: Patient: Virtual Visit Location Patient:  Home Provider: Virtual Visit Location Provider: Home Office   I discussed the limitations of evaluation and management by telemedicine and the availability of in person appointments. The patient expressed understanding and agreed to proceed.    History of Present Illness: Joy Adams is a 41 y.o. who identifies as a female who was assigned female at birth, and is being seen today for covid positive testing, sx for 2 days, history of MI and stroke. Sx HA, body aches, fever, chills, cough, sob, head congestion worsening. Marland Kitchen  HPI: HPI  Problems: There are no problems to display for this patient.   Allergies:  Allergies  Allergen Reactions   Amoxicillin Anaphylaxis   Penicillins Anaphylaxis   Cefdinir Hives   Ivp Dye [Iodinated Contrast Media] Itching and Nausea And Vomiting    OK if given benadryl   Tape     Tegaderm burns skin   Augmentin [Amoxicillin-Pot Clavulanate] Rash   Limonene Rash   Medications:  Current Outpatient Medications:    benzonatate (TESSALON) 200 MG capsule, Take 1 capsule (200 mg total) by mouth 2 (two) times daily as needed for cough., Disp: 20 capsule, Rfl: 0   nirmatrelvir/ritonavir EUA (PAXLOVID) 20 x 150 MG & 10 x 100MG  TABS, Take 3 tablets by mouth 2 (two) times daily for 5 days. (Take nirmatrelvir 150 mg two tablets twice daily for 5 days and ritonavir 100 mg one tablet twice daily for 5 days) Patient GFR is >60, Disp: 30 tablet, Rfl: 0   predniSONE (DELTASONE) 20  MG tablet, Take 1 tablet (20 mg total) by mouth 2 (two) times daily with a meal for 5 days., Disp: 10 tablet, Rfl: 0   apixaban (ELIQUIS) 5 MG TABS tablet, Take by mouth., Disp: , Rfl:    aspirin 81 MG EC tablet, Take by mouth., Disp: , Rfl:    atorvastatin (LIPITOR) 40 MG tablet, Take 40 mg by mouth daily., Disp: , Rfl:    butalbital-acetaminophen-caffeine (FIORICET, ESGIC) 50-325-40 MG per tablet, Take 1 tablet by mouth 2 (two) times daily as needed for headache., Disp: , Rfl:    cetirizine  (ZYRTEC) 10 MG tablet, Take 1 tablet (10 mg total) by mouth daily., Disp: 30 tablet, Rfl: 0   fluticasone (FLONASE) 50 MCG/ACT nasal spray, Place 1 spray into both nostrils daily., Disp: 1 g, Rfl: 0   furosemide (LASIX) 20 MG tablet, Take 1 tablet by mouth daily as needed. (Patient not taking: Reported on 08/17/2021), Disp: , Rfl:    gabapentin (NEURONTIN) 600 MG tablet, Take 600 mg by mouth 3 (three) times daily., Disp: , Rfl:    ipratropium (ATROVENT) 0.06 % nasal spray, Place 2 sprays into both nostrils 4 (four) times daily. (Patient not taking: Reported on 08/17/2021), Disp: 15 mL, Rfl: 12   levonorgestrel (MIRENA) 20 MCG/24HR IUD, 1 each by Intrauterine route once., Disp: , Rfl:    nitroGLYCERIN (NITROSTAT) 0.4 MG SL tablet, Place 0.4 mg under the tongue every 5 (five) minutes as needed for chest pain., Disp: , Rfl:    oxybutynin (DITROPAN-XL) 5 MG 24 hr tablet, Take 5 mg by mouth daily., Disp: , Rfl:    pantoprazole (PROTONIX) 20 MG tablet, Take 20 mg by mouth daily., Disp: , Rfl:    topiramate (TOPAMAX) 50 MG tablet, Take 50 mg by mouth 2 (two) times daily., Disp: , Rfl:    triamcinolone (KENALOG) 0.1 %, Apply 1 application topically 2 (two) times daily., Disp: 30 g, Rfl: 0  Observations/Objective: Patient is well-developed, well-nourished in no acute distress.  Resting comfortably  at home.  Head is normocephalic, atraumatic.  No labored breathing.  Speech is clear and coherent with logical content.  Patient is alert and oriented at baseline.    Assessment and Plan: 1. COVID  Increase fluids, humidifier at night, tylenol as directed, urgent care if sx worsen, MVI with Vit d and zinc, quarantine discussed.   Follow Up Instructions: I discussed the assessment and treatment plan with the patient. The patient was provided an opportunity to ask questions and all were answered. The patient agreed with the plan and demonstrated an understanding of the instructions.  A copy of instructions  were sent to the patient via MyChart unless otherwise noted below.     The patient was advised to call back or seek an in-person evaluation if the symptoms worsen or if the condition fails to improve as anticipated.  Time:  I spent 10 minutes with the patient via telehealth technology discussing the above problems/concerns.    Dellia Nims, FNP

## 2022-10-31 ENCOUNTER — Ambulatory Visit
Admission: EM | Admit: 2022-10-31 | Discharge: 2022-10-31 | Disposition: A | Discharge status: Home / Self Care | Payer: 59 | Attending: Physician Assistant | Admitting: Physician Assistant

## 2022-10-31 ENCOUNTER — Other Ambulatory Visit: Payer: Self-pay

## 2022-10-31 DIAGNOSIS — Z79899 Other long term (current) drug therapy: Secondary | ICD-10-CM | POA: Diagnosis not present

## 2022-10-31 DIAGNOSIS — J069 Acute upper respiratory infection, unspecified: Secondary | ICD-10-CM | POA: Diagnosis present

## 2022-10-31 DIAGNOSIS — J029 Acute pharyngitis, unspecified: Secondary | ICD-10-CM | POA: Insufficient documentation

## 2022-10-31 DIAGNOSIS — R051 Acute cough: Secondary | ICD-10-CM

## 2022-10-31 DIAGNOSIS — Z7901 Long term (current) use of anticoagulants: Secondary | ICD-10-CM | POA: Insufficient documentation

## 2022-10-31 DIAGNOSIS — Z1152 Encounter for screening for COVID-19: Secondary | ICD-10-CM | POA: Insufficient documentation

## 2022-10-31 DIAGNOSIS — Z8673 Personal history of transient ischemic attack (TIA), and cerebral infarction without residual deficits: Secondary | ICD-10-CM | POA: Diagnosis not present

## 2022-10-31 DIAGNOSIS — E7800 Pure hypercholesterolemia, unspecified: Secondary | ICD-10-CM | POA: Diagnosis not present

## 2022-10-31 DIAGNOSIS — K219 Gastro-esophageal reflux disease without esophagitis: Secondary | ICD-10-CM | POA: Diagnosis not present

## 2022-10-31 LAB — GROUP A STREP BY PCR: Group A Strep by PCR: NOT DETECTED

## 2022-10-31 LAB — SARS CORONAVIRUS 2 BY RT PCR: SARS Coronavirus 2 by RT PCR: NEGATIVE

## 2022-10-31 MED ORDER — PROMETHAZINE-DM 6.25-15 MG/5ML PO SYRP: 5.0000 mL | ORAL_SOLUTION | Freq: Four times a day (QID) | ORAL | 0 refills | Status: DC | PRN

## 2022-10-31 NOTE — ED Triage Notes (Signed)
Sore throat, chills, headache and cough x 3 days not sure of fevers.

## 2022-10-31 NOTE — Discharge Instructions (Signed)
URI/COLD SYMPTOMS: Your exam today is consistent with a viral illness. Antibiotics are not indicated at this time. Use medications as directed, including cough syrup, nasal saline, and decongestants. Your symptoms should improve over the next few days and resolve within 7-10 days. Increase rest and fluids. F/u if symptoms worsen or predominate such as sore throat, ear pain, productive cough, shortness of breath, or if you develop high fevers or worsening fatigue over the next several days.    

## 2022-10-31 NOTE — ED Provider Notes (Signed)
[facility]    CSN: [id number] Arrival date & time: 10/31/22  [id number]      History   Chief Complaint Chief Complaint  Patient presents with   Sore Throat   Headache   Cough    HPI Joy Adams is a 42 y.o. female presenting for fatigue, chills, sore throat, cough, congestion, headaches x 3 days.  Denies fever, sinus pain, chest pain, shortness of breath, vomiting or diarrhea.  Denies any known exposure to COVID or flu.  Has been exposed to strep.  Has taken OTC meds.  No other complaints or concerns.  HPI  Past Medical History:  Diagnosis Date   GERD (gastroesophageal reflux disease)    Hypercholesteremia    MI (myocardial infarction) ([place]) 2014   Migraines    "All the time"   Stroke [facility]) 2012   Some memory issues.  Some right sided weakness.   Wears dentures    Has partial upper and lower.  Doesn't wear.    There are no problems to display for this patient.   Past Surgical History:  Procedure Laterality Date   CARDIAC SURGERY     CHOLECYSTECTOMY      OB History   No obstetric history on file.      Home Medications    Prior to Admission medications   Medication Sig Start Date End Date Taking? Authorizing Provider  promethazine-dextromethorphan (PROMETHAZINE-DM) 6.25-15 MG/5ML syrup Take 5 mLs by mouth 4 (four) times daily as needed. 10/31/22  Yes Laurene Footman B, PA-C  apixaban (ELIQUIS) 5 MG TABS tablet Take by mouth. 04/19/21   [provider]  aspirin 81 MG EC tablet Take by mouth.    [provider]  atorvastatin (LIPITOR) 40 MG tablet Take 40 mg by mouth daily.    [provider]  benzonatate (TESSALON) 200 MG capsule Take 1 capsule (200 mg total) by mouth 2 (two) times daily as needed for cough. 07/23/22   Tempie Hoist, FNP  butalbital-acetaminophen-caffeine (FIORICET, ESGIC) [phone] MG per tablet Take 1 tablet by mouth 2 (two) times daily as needed for headache.    [provider]  cetirizine  (ZYRTEC) 10 MG tablet Take 1 tablet (10 mg total) by mouth daily. 09/17/19   Luvenia Redden, PA-C  fluticasone (FLONASE) 50 MCG/ACT nasal spray Place 1 spray into both nostrils daily. 04/02/18   Law, Bea Graff, PA-C  furosemide (LASIX) 20 MG tablet Take 1 tablet by mouth daily as needed. Patient not taking: Reported on 08/17/2021    [provider]  gabapentin (NEURONTIN) 600 MG tablet Take 600 mg by mouth 3 (three) times daily.    [provider]  ipratropium (ATROVENT) 0.06 % nasal spray Place 2 sprays into both nostrils 4 (four) times daily. Patient not taking: Reported on 08/17/2021 05/09/21   Margarette Canada, NP  levonorgestrel (MIRENA) 20 MCG/24HR IUD 1 each by Intrauterine route once.    [provider]  nitroGLYCERIN (NITROSTAT) 0.4 MG SL tablet Place 0.4 mg under the tongue every 5 (five) minutes as needed for chest pain.    [provider]  oxybutynin (DITROPAN-XL) 5 MG 24 hr tablet Take 5 mg by mouth daily.    [provider]  pantoprazole (PROTONIX) 20 MG tablet Take 20 mg by mouth daily. 08/06/19   [provider]  topiramate (TOPAMAX) 50 MG tablet Take 50 mg by mouth 2 (two) times daily.    [provider]  triamcinolone (KENALOG) 0.1 % Apply 1  application topically 2 (two) times daily. 07/11/20   Margarette Canada, NP  losartan (COZAAR) 25 MG tablet Take 25 mg by mouth daily.  09/17/19  [provider]    Family History Family History  Problem Relation Age of Onset   Other Mother        unknown medical history   Heart attack Father     Social History Social History   Tobacco Use   Smoking status: Never   Smokeless tobacco: Never  Vaping Use   Vaping Use: Never used  Substance Use Topics   Alcohol use: No   Drug use: No     Allergies   Amoxicillin, Penicillins, Cefdinir, Ivp dye [iodinated contrast media], Tape, Augmentin [amoxicillin-pot clavulanate], and Limonene   Review of Systems Review of  Systems  Constitutional:  Positive for fatigue. Negative for chills, diaphoresis and fever.  HENT:  Positive for congestion, rhinorrhea and sore throat. Negative for ear pain, sinus pressure and sinus pain.   Respiratory:  Positive for cough. Negative for shortness of breath.   Cardiovascular:  Negative for chest pain.  Gastrointestinal:  Negative for abdominal pain, nausea and vomiting.  Musculoskeletal:  Negative for arthralgias and myalgias.  Skin:  Negative for rash.  Neurological:  Positive for headaches. Negative for weakness.  Hematological:  Negative for adenopathy.     Physical Exam Triage Vital Signs ED Triage Vitals  Enc Vitals Group     BP      Pulse      Resp      Temp      Temp src      SpO2      Weight      Height      Head Circumference      Peak Flow      Pain Score      Pain Loc      Pain Edu?      Excl. in [place]?    No data found.  Updated Vital Signs BP 118/63   Pulse 68   Temp 98 F (36.7 C)   Resp 20   SpO2 99%   Physical Exam Vitals and nursing note reviewed.  Constitutional:      General: She is not in acute distress.    Appearance: Normal appearance. She is well-developed. She is not ill-appearing or toxic-appearing.  HENT:     Head: Normocephalic and atraumatic.     Nose: Congestion present.     Mouth/Throat:     Mouth: Mucous membranes are moist.     Pharynx: Oropharynx is clear. Posterior oropharyngeal erythema present.  Eyes:     General: No scleral icterus.       Right eye: No discharge.        Left eye: No discharge.     Conjunctiva/sclera: Conjunctivae normal.  Cardiovascular:     Rate and Rhythm: Normal rate and regular rhythm.     Heart sounds: Normal heart sounds.  Pulmonary:     Effort: Pulmonary effort is normal. No respiratory distress.     Breath sounds: Normal breath sounds.  Musculoskeletal:     Cervical back: Neck supple.  Skin:    General: Skin is dry.  Neurological:     General: No focal deficit present.      Mental Status: She is alert. Mental status is at baseline.     Motor: No weakness.     Gait: Gait normal.  Psychiatric:        Mood and  Affect: Mood normal.        Behavior: Behavior normal.        Thought Content: Thought content normal.      UC Treatments / Results  Labs (all labs ordered are listed, but only abnormal results are displayed) Labs Reviewed  SARS CORONAVIRUS 2 BY RT PCR  GROUP A STREP BY PCR    EKG   Radiology No results found.  Procedures Procedures (including critical care time)  Medications Ordered in UC Medications - No data to display  Initial Impression / Assessment and Plan / UC Course  I have reviewed the triage vital signs and the nursing notes.  Pertinent labs & imaging results that were available during my care of the patient were reviewed by me and considered in my medical decision making (see chart for details).   42 year old female presents for fatigue, cough, congestion, sore throat x 3 days.  Vitals normal and stable and she is overall well-appearing.  Mild nasal congestion and erythema posterior pharynx.  Chest clear to auscultation.  COVID testing and strep testing obtained.  All negative.  Discussed all results with patient.  Viral URI.  Sent Promethazine DM to pharmacy.  Supportive care encouraged with increasing rest and fluids, cough medication, Tylenol /Motrin.  Reviewed return to ER precautions.    Final Clinical Impressions(s) / UC Diagnoses   Final diagnoses:  Viral upper respiratory tract infection  Acute cough  Sore throat     Discharge Instructions      URI/COLD SYMPTOMS: Your exam today is consistent with a viral illness. Antibiotics are not indicated at this time. Use medications as directed, including cough syrup, nasal saline, and decongestants. Your symptoms should improve over the next few days and resolve within 7-10 days. Increase rest and fluids. F/u if symptoms worsen or predominate such as sore throat,  ear pain, productive cough, shortness of breath, or if you develop high fevers or worsening fatigue over the next several days.       ED Prescriptions     Medication Sig Dispense Auth. Provider   promethazine-dextromethorphan (PROMETHAZINE-DM) 6.25-15 MG/5ML syrup Take 5 mLs by mouth 4 (four) times daily as needed. 118 mL Danton Clap, PA-C      PDMP not reviewed this encounter.   Danton Clap, PA-C 10/31/22 1207

## 2022-11-21 ENCOUNTER — Ambulatory Visit (INDEPENDENT_AMBULATORY_CARE_PROVIDER_SITE_OTHER): Payer: 59

## 2022-11-21 ENCOUNTER — Ambulatory Visit: Payer: 59

## 2022-11-21 ENCOUNTER — Ambulatory Visit
Admission: EM | Admit: 2022-11-21 | Discharge: 2022-11-21 | Disposition: A | Discharge status: Home / Self Care | Payer: 59 | Attending: Physician Assistant | Admitting: Physician Assistant

## 2022-11-21 DIAGNOSIS — R0989 Other specified symptoms and signs involving the circulatory and respiratory systems: Secondary | ICD-10-CM | POA: Diagnosis not present

## 2022-11-21 DIAGNOSIS — J069 Acute upper respiratory infection, unspecified: Secondary | ICD-10-CM

## 2022-11-21 DIAGNOSIS — R051 Acute cough: Secondary | ICD-10-CM

## 2022-11-21 DIAGNOSIS — J208 Acute bronchitis due to other specified organisms: Secondary | ICD-10-CM

## 2022-11-21 DIAGNOSIS — R0602 Shortness of breath: Secondary | ICD-10-CM | POA: Diagnosis not present

## 2022-11-21 MED ORDER — SPACER/AERO-HOLDING CHAMBERS DEVI: 1.0000 [IU] | Freq: Three times a day (TID) | 0 refills | Status: AC

## 2022-11-21 MED ORDER — PREDNISONE 10 MG PO TABS: 10.0000 mg | ORAL_TABLET | Freq: Three times a day (TID) | ORAL | 0 refills | Status: DC

## 2022-11-21 MED ORDER — DM-GUAIFENESIN ER 30-600 MG PO TB12: 1.0000 | ORAL_TABLET | Freq: Two times a day (BID) | ORAL | 0 refills | Status: DC

## 2022-11-21 MED ORDER — ALBUTEROL SULFATE HFA 108 (90 BASE) MCG/ACT IN AERS: 2.0000 | INHALATION_SPRAY | Freq: Four times a day (QID) | RESPIRATORY_TRACT | 0 refills | Status: DC | PRN

## 2022-11-21 NOTE — Discharge Instructions (Addendum)
Advised to use the albuterol inhaler with spacer, 2 puffs every 6 hours on a regular basis to help decrease cough, chest congestion. Advised take the Mucinex DM every 12 hours to help reduce cough and chest congestion. Advised take prednisone 10 mg 3 times a day for 4 days only to help reduce the acute inflammatory response.  Advised follow-up PCP return to urgent care if symptoms fail to improve.

## 2022-11-21 NOTE — ED Provider Notes (Signed)
[facility]    CSN: [id number] Arrival date & time: 11/21/22  1451      History   Chief Complaint No chief complaint on file.   HPI Joy Adams is a 42 y.o. female.   42 year old female presents with cough, congestion.  Patient indicates for the past 4 weeks she has been having persistent upper respiratory congestion with rhinitis, postnasal drip, bilateral ear congestion, intermittent sore throat which is mainly clear production.  Patient also indicates that she has been having recurrent chest congestion with intermittent cough and coughing spasms.  Patient indicates that when she coughs she "urinates on herself".  Patient indicates that this has only been occurring since she has had this persistent and nagging cough.  She indicates she has been taking some OTC medications but this is not helped to reduce her symptoms.  She does indicate that she was seen first in urgent care given some medicine which did not help her symptoms.  She relates she was recently seen by her PCP who advised her to take some allergy medicine in which the patient indicates it did not help her symptoms either.  She is without fever or chills.  She indicates she has very mild wheezing and it is mainly during the evening, mild shortness of breath.  She is tolerating fluids well.     Past Medical History:  Diagnosis Date   GERD (gastroesophageal reflux disease)    Hypercholesteremia    MI (myocardial infarction) 2014   Migraines    "All the time"   Stroke 2012   Some memory issues.  Some right sided weakness.   Wears dentures    Has partial upper and lower.  Doesn't wear.    There are no problems to display for this patient.   Past Surgical History:  Procedure Laterality Date   CARDIAC SURGERY     CHOLECYSTECTOMY      OB History   No obstetric history on file.      Home Medications    Prior to Admission medications   Medication Sig Start Date End Date Taking? Authorizing  Provider  albuterol (VENTOLIN HFA) 108 (90 Base) MCG/ACT inhaler Inhale 2 puffs into the lungs every 6 (six) hours as needed for wheezing or shortness of breath. 11/21/22  Yes Ellsworth Lennox, PA-C  dextromethorphan-guaiFENesin [facility] DM) 30-600 MG 12hr tablet Take 1 tablet by mouth 2 (two) times daily. 11/21/22  Yes Ellsworth Lennox, PA-C  predniSONE (DELTASONE) 10 MG tablet Take 1 tablet (10 mg total) by mouth in the morning, at noon, and at bedtime. 11/21/22  Yes Ellsworth Lennox, PA-C  Spacer/Aero-Holding Chambers DEVI 1 Units by Does not apply route in the morning, at noon, and at bedtime. 11/21/22  Yes Ellsworth Lennox, PA-C  apixaban (ELIQUIS) 5 MG TABS tablet Take by mouth. 04/19/21   [provider]  aspirin 81 MG EC tablet Take by mouth.    [provider]  atorvastatin (LIPITOR) 40 MG tablet Take 40 mg by mouth daily.    [provider]  benzonatate (TESSALON) 200 MG capsule Take 1 capsule (200 mg total) by mouth 2 (two) times daily as needed for cough. 07/23/22   Delorse Lek, FNP  butalbital-acetaminophen-caffeine (FIORICET, ESGIC) [phone] MG per tablet Take 1 tablet by mouth 2 (two) times daily as needed for headache.    [provider]  cetirizine (ZYRTEC) 10 MG tablet Take 1 tablet (10 mg total) by mouth daily. 09/17/19   Candis Schatz, PA-C  fluticasone (FLONASE) 50 MCG/ACT nasal spray Place 1 spray into both nostrils daily. 04/02/18   Law, Waylan Boga, PA-C  furosemide (LASIX) 20 MG tablet Take 1 tablet by mouth daily as needed. Patient not taking: Reported on 08/17/2021    [provider]  gabapentin (NEURONTIN) 600 MG tablet Take 600 mg by mouth 3 (three) times daily.    [provider]  ipratropium (ATROVENT) 0.06 % nasal spray Place 2 sprays into both nostrils 4 (four) times daily. Patient not taking: Reported on 08/17/2021 05/09/21   Becky Augusta, NP  levonorgestrel (MIRENA) 20 MCG/24HR IUD 1 each by Intrauterine route once.    [provider]  nitroGLYCERIN (NITROSTAT) 0.4 MG SL tablet Place 0.4 mg under the tongue every 5 (five) minutes as needed for chest pain.    [provider]  oxybutynin (DITROPAN-XL) 5 MG 24 hr tablet Take 5 mg by mouth daily.    [provider]  pantoprazole (PROTONIX) 20 MG tablet Take 20 mg by mouth daily. 08/06/19   [provider]  promethazine-dextromethorphan (PROMETHAZINE-DM) 6.25-15 MG/5ML syrup Take 5 mLs by mouth 4 (four) times daily as needed. 10/31/22   Shirlee Latch, PA-C  topiramate (TOPAMAX) 50 MG tablet Take 50 mg by mouth 2 (two) times daily.    [provider]  triamcinolone (KENALOG) 0.1 % Apply 1 application topically 2 (two) times daily. 07/11/20   Becky Augusta, NP  losartan (COZAAR) 25 MG tablet Take 25 mg by mouth daily.  09/17/19  [provider]    Family History Family History  Problem Relation Age of Onset   Other Mother        unknown medical history   Heart attack Father     Social History Social History   Tobacco Use   Smoking status: Never   Smokeless tobacco: Never  Vaping Use   Vaping Use: Never used  Substance Use Topics   Alcohol use: No   Drug use: No     Allergies   Amoxicillin, Penicillins, Cefdinir, Ivp dye [iodinated contrast media], Tape, Augmentin [amoxicillin-pot clavulanate], and Limonene   Review of Systems Review of Systems  HENT:  Positive for postnasal drip and sinus pressure.   Respiratory:  Positive for cough, shortness of breath and wheezing.      Physical Exam Triage Vital Signs ED Triage Vitals  Enc Vitals Group     BP 11/21/22 1455 118/81     Pulse Rate 11/21/22 1455 87     Resp --      Temp 11/21/22 1455 98.2 F (36.8 C)     Temp Source 11/21/22 1455 Oral     SpO2 11/21/22 1455 94 %     Weight --      Height --      Head Circumference --      Peak Flow --      Pain Score 11/21/22 1500 7     Pain Loc --      Pain Edu? --      Excl. in [place]? --    No data  found.  Updated Vital Signs BP 118/81 (BP Location: Left Arm)   Pulse 87   Temp 98.2 F (36.8 C) (Oral)   LMP  (LMP Unknown)   SpO2 94%   Visual Acuity Right Eye Distance:   Left Eye Distance:   Bilateral Distance:    Right Eye Near:   Left Eye Near:    Bilateral Near:     Physical  Exam Constitutional:      Appearance: Normal appearance.  HENT:     Right Ear: Ear canal normal. Tympanic membrane is injected.     Left Ear: Ear canal normal. Tympanic membrane is injected.     Mouth/Throat:     Mouth: Mucous membranes are moist.     Pharynx: Oropharynx is clear.  Cardiovascular:     Rate and Rhythm: Normal rate and regular rhythm.     Heart sounds: Normal heart sounds.  Pulmonary:     Effort: Pulmonary effort is normal.     Breath sounds: Normal air entry. No wheezing, rhonchi or rales.  Lymphadenopathy:     Cervical: No cervical adenopathy.  Neurological:     Mental Status: She is alert.      UC Treatments / Results  Labs (all labs ordered are listed, but only abnormal results are displayed) Labs Reviewed - No data to display  EKG   Radiology DG Chest 2 View  Result Date: 11/21/2022 CLINICAL DATA:  Shortness of breath and congestion EXAM: CHEST - 2 VIEW COMPARISON:  04/02/2022 FINDINGS: Cardiac and mediastinal contours are within normal limits. No focal pulmonary opacity. No pleural effusion or pneumothorax. No acute osseous abnormality. IMPRESSION: No acute cardiopulmonary process. Electronically Signed   By: Wiliam Ke M.D.   On: 11/21/2022 15:46    Procedures Procedures (including critical care time)  Medications Ordered in UC Medications - No data to display  Initial Impression / Assessment and Plan / UC Course  I have reviewed the triage vital signs and the nursing notes.  Pertinent labs & imaging results that were available during my care of the patient were reviewed by me and considered in my medical decision making (see chart for details).     Plan: The diagnosis will be treated with the following: 1.  Acute cough: A.  Mucinex DM every 12 hours to control cough and congestion. B.  Albuterol inhaler with spacer, 2 puffs every 6 hours on a regular basis to help decrease cough, wheezing, shortness of breath. 2.  Upper respiratory tract infection: A.  Mucinex DM every 12 hours to control cough and congestion. 3.  Acute bronchitis: A.  Albuterol inhaler with spacer, 2 puffs every 6 hours on a regular basis to help decrease cough, wheezing, shortness of breath. B.  Prednisone 10 mg 3 times a day for 4 days only to help reduce the acute inflammatory response. 4.  Patient advised follow-up PCP return to urgent care as needed. Final Clinical Impressions(s) / UC Diagnoses   Final diagnoses:  Acute cough  Acute upper respiratory infection  Acute bronchitis due to other specified organisms     Discharge Instructions      Advised to use the albuterol inhaler with spacer, 2 puffs every 6 hours on a regular basis to help decrease cough, chest congestion. Advised take the Mucinex DM every 12 hours to help reduce cough and chest congestion. Advised take prednisone 10 mg 3 times a day for 4 days only to help reduce the acute inflammatory response.  Advised follow-up PCP return to urgent care if symptoms fail to improve.    ED Prescriptions     Medication Sig Dispense Auth. Provider   albuterol (VENTOLIN HFA) 108 (90 Base) MCG/ACT inhaler Inhale 2 puffs into the lungs every 6 (six) hours as needed for wheezing or shortness of breath. 8 g Ellsworth Lennox, PA-C   Spacer/Aero-Holding Chambers DEVI 1 Units by Does not apply route in the morning, at  noon, and at bedtime. 1 Units Ellsworth Lennox, PA-C   predniSONE (DELTASONE) 10 MG tablet Take 1 tablet (10 mg total) by mouth in the morning, at noon, and at bedtime. 12 tablet Ellsworth Lennox, PA-C   dextromethorphan-guaiFENesin [facility] DM) 30-600 MG 12hr tablet Take 1 tablet by mouth 2 (two) times  daily. 20 tablet Ellsworth Lennox, PA-C      PDMP not reviewed this encounter.   Ellsworth Lennox, PA-C 11/21/22 1551

## 2022-11-21 NOTE — ED Triage Notes (Signed)
Pt c/o cough; wheezing; SOB; congestion; x 1 month pt states she has had a problem with coughing fits that make her urinate unexpectedly.   Pt has tried otc cough drops, seen a provider.

## 2023-10-16 ENCOUNTER — Ambulatory Visit
Admission: EM | Admit: 2023-10-16 | Discharge: 2023-10-16 | Disposition: A | Discharge status: Home / Self Care | Attending: Emergency Medicine | Admitting: Emergency Medicine

## 2023-10-16 ENCOUNTER — Encounter: Payer: Self-pay | Admitting: Emergency Medicine

## 2023-10-16 DIAGNOSIS — M26623 Arthralgia of bilateral temporomandibular joint: Secondary | ICD-10-CM | POA: Diagnosis not present

## 2023-10-16 MED ORDER — PREDNISONE 20 MG PO TABS: 40.0000 mg | ORAL_TABLET | Freq: Every day | ORAL | 0 refills | Status: AC

## 2023-10-16 MED ORDER — CYCLOBENZAPRINE HCL 10 MG PO TABS: 10.0000 mg | ORAL_TABLET | Freq: Every day | ORAL | 0 refills | Status: DC

## 2023-10-16 NOTE — ED Provider Notes (Signed)
HPI  SUBJECTIVE:  Joy Adams is a 43 y.o. female who presents with bilateral ear pain, decreased hearing, nasal congestion, tinnitus starting 2 days ago.  She states that her allergies are starting to bother her with itchy, watery eyes.  No fevers, rhinorrhea, vertigo, ear popping.  She wears ear buds on a regular basis.  She also clenches her jaw and grinds her teeth while awake.  Not sure if she does this at night.  No antibiotics in the past month.  No antipyretic in the past 6 hours.  She tried applying heat with improvement in her symptoms.  No aggravating factors.  She has a past medical history of frequent migraines, stroke, MI, hypercholesterolemia, GERD, allergies on Claritin, and is on Eliquis.  No history of TMJ arthralgia.  [facility]  Past Medical History:  Diagnosis Date   GERD (gastroesophageal reflux disease)    Hypercholesteremia    MI (myocardial infarction) (HCC) 2014   Migraines    "All the time"   Stroke [facility]) 2012   Some memory issues.  Some right sided weakness.   Wears dentures    Has partial upper and lower.  Doesn't wear.    Past Surgical History:  Procedure Laterality Date   CARDIAC SURGERY     CHOLECYSTECTOMY      Family History  Problem Relation Age of Onset   Other Mother        unknown medical history   Heart attack Father     Social History   Tobacco Use   Smoking status: Never   Smokeless tobacco: Never  Vaping Use   Vaping status: Never Used  Substance Use Topics   Alcohol use: No   Drug use: No    No current facility-administered medications for this encounter.  Current Outpatient Medications:    cyclobenzaprine (FLEXERIL) 10 MG tablet, Take 1 tablet (10 mg total) by mouth at bedtime., Disp: 20 tablet, Rfl: 0   predniSONE (DELTASONE) 20 MG tablet, Take 2 tablets (40 mg total) by mouth daily with breakfast for 5 days., Disp: 10 tablet, Rfl: 0   albuterol (VENTOLIN HFA) 108 (90 Base) MCG/ACT inhaler, Inhale 2 puffs into  the lungs every 6 (six) hours as needed for wheezing or shortness of breath., Disp: 8 g, Rfl: 0   apixaban (ELIQUIS) 5 MG TABS tablet, Take by mouth., Disp: , Rfl:    aspirin 81 MG EC tablet, Take by mouth., Disp: , Rfl:    atorvastatin (LIPITOR) 40 MG tablet, Take 40 mg by mouth daily., Disp: , Rfl:    butalbital-acetaminophen-caffeine (FIORICET, ESGIC) 50-325-40 MG per tablet, Take 1 tablet by mouth 2 (two) times daily as needed for headache., Disp: , Rfl:    cetirizine (ZYRTEC) 10 MG tablet, Take 1 tablet (10 mg total) by mouth daily., Disp: 30 tablet, Rfl: 0   fluticasone (FLONASE) 50 MCG/ACT nasal spray, Place 1 spray into both nostrils daily., Disp: 1 g, Rfl: 0   furosemide (LASIX) 20 MG tablet, Take 1 tablet by mouth daily as needed. (Patient not taking: Reported on 08/17/2021), Disp: , Rfl:    gabapentin (NEURONTIN) 600 MG tablet, Take 600 mg by mouth 3 (three) times daily., Disp: , Rfl:    levonorgestrel (MIRENA) 20 MCG/24HR IUD, 1 each by Intrauterine route once., Disp: , Rfl:    nitroGLYCERIN (NITROSTAT) 0.4 MG SL tablet, Place 0.4 mg under the tongue every 5 (five) minutes as needed for chest pain., Disp: , Rfl:    oxybutynin (DITROPAN-XL)  5 MG 24 hr tablet, Take 5 mg by mouth daily., Disp: , Rfl:    pantoprazole (PROTONIX) 20 MG tablet, Take 20 mg by mouth daily., Disp: , Rfl:    Spacer/Aero-Holding Chambers DEVI, 1 Units by Does not apply route in the morning, at noon, and at bedtime., Disp: 1 Units, Rfl: 0   topiramate (TOPAMAX) 50 MG tablet, Take 50 mg by mouth 2 (two) times daily., Disp: , Rfl:    triamcinolone (KENALOG) 0.1 %, Apply 1 application topically 2 (two) times daily., Disp: 30 g, Rfl: 0  Allergies  Allergen Reactions   Amoxicillin Anaphylaxis   Penicillins Anaphylaxis   Cefdinir Hives   Ivp Dye [Iodinated Contrast Media] Itching and Nausea And Vomiting    OK if given benadryl   Tape     Tegaderm burns skin   Augmentin [Amoxicillin-Pot Clavulanate] Rash   Limonene  Rash     ROS  As noted in HPI.   Physical Exam  BP 118/84   Pulse 82   Temp 98.1 F (36.7 C) (Oral)   Resp 18   SpO2 100%   Constitutional: Well developed, well nourished, no acute distress Eyes: PERRL, EOMI, conjunctiva normal bilaterally HENT: Normocephalic, atraumatic,mucus membranes moist.  Positive nasal congestion. Left ear: Left external ear, EAC, TM normal.  No pain with traction on pinna.  Tenderness with palpation of tragus.  No tenderness with palpation of mastoid.  Positive TMJ tenderness.  No jaw crepitus. Right ear: Right external ear, EAC, TM normal.  pain with traction on pinna.  Tenderness with palpation of tragus.  No tenderness with palpation of mastoid.  Positive TMJ tenderness.  No jaw crepitus. Neck: No cervical lymphadenopathy Respiratory: Clear to auscultation bilaterally, no rales, no wheezing, no rhonchi Cardiovascular: Normal rate and rhythm, no murmurs, no gallops, no rubs GI: nondistended skin: No rash, skin intact Musculoskeletal: no deformities Neurologic: Alert & oriented x 3, CN III-XII grossly intact, no motor deficits, sensation grossly intact Psychiatric: Speech and behavior appropriate   ED Course   Medications - No data to display  No orders of the defined types were placed in this encounter.  No results found for this or any previous visit (from the past 24 hours). No results found.  ED Clinical Impression  1. Bilateral temporomandibular joint pain      ED Assessment/Plan     She has anaphylaxis with penicillins and hives with cefdinir.  Patient presents with bilateral otalgia, no evidence of otitis media or otitis externa.  Suspect bilateral TMJ arthralgia.  Home with prednisone 40 mg for 5 days, soft diet, Flexeril to take at night, Tylenol 1000 mg 3-4 times a day as needed for pain.  She is to follow-up with her dentist for a bite guard.  Discussed  MDM, treatment plan, and plan for follow-up with patient patient agrees  with plan.   Meds ordered this encounter  Medications   cyclobenzaprine (FLEXERIL) 10 MG tablet    Sig: Take 1 tablet (10 mg total) by mouth at bedtime.    Dispense:  20 tablet    Refill:  0   predniSONE (DELTASONE) 20 MG tablet    Sig: Take 2 tablets (40 mg total) by mouth daily with breakfast for 5 days.    Dispense:  10 tablet    Refill:  0      *This clinic note was created using Scientist, clinical (histocompatibility and immunogenetics). Therefore, there may be occasional mistakes despite careful proofreading. ?    Domenick Gong, MD  10/18/23 1721  

## 2023-10-16 NOTE — Discharge Instructions (Addendum)
 prednisone 40 mg for 5 days, soft diet for the next 5 days, Flexeril to take at night, Tylenol 1000 mg 3-4 times a day as needed for pain.  follow-up with your dentist for a bite guard.

## 2023-10-16 NOTE — ED Triage Notes (Signed)
Pt presents with bilateral ear pain x 2 days

## 2023-11-09 ENCOUNTER — Ambulatory Visit
Admission: EM | Admit: 2023-11-09 | Discharge: 2023-11-09 | Disposition: A | Discharge status: Home / Self Care | Attending: Family Medicine | Admitting: Family Medicine

## 2023-11-09 DIAGNOSIS — R1012 Left upper quadrant pain: Secondary | ICD-10-CM | POA: Insufficient documentation

## 2023-11-09 DIAGNOSIS — N390 Urinary tract infection, site not specified: Secondary | ICD-10-CM | POA: Diagnosis present

## 2023-11-09 LAB — URINALYSIS, W/ REFLEX TO CULTURE (INFECTION SUSPECTED)
Bilirubin Urine: NEGATIVE
Glucose, UA: NEGATIVE mg/dL
Hgb urine dipstick: NEGATIVE
Ketones, ur: NEGATIVE mg/dL
Nitrite: NEGATIVE
Protein, ur: 30 mg/dL — AB
RBC / HPF: NONE SEEN RBC/hpf (ref 0–5)
Specific Gravity, Urine: >1.03 — ABNORMAL HIGH (ref 1.005–1.030)
pH: 5.5 (ref 5.0–8.0)

## 2023-11-09 NOTE — ED Provider Notes (Addendum)
 [facility]    CSN: [id number] Arrival date & time: 11/09/23  1505      History   Chief Complaint Chief Complaint  Patient presents with   Urinary Tract Infection    HPI Joy Adams is a 43 y.o. female presents for urinary symptoms.  Patient was seen in the emergency room on 3/28 for right lower quadrant abdominal pain.  She did have a CT abdomen pelvis showed mild urothelial thickening in the bilateral ureters, could be a ascending urinary infection, no evidence of nephrolithiasis or acute pyelonephritis, hepatic steatosis.  UA showed few white blood cells otherwise unremarkable.  She was discharged on 500 mg Cipro twice daily for 7 days for treatment of UTI.  She states she has been taking this as prescribed and finished it yesterday.  She reports no change in her symptoms and continues to report generalized abdominal pain in the right lower quadrant as well as the left upper quadrant.  Continues to report urinary symptoms as well.  Denies any fevers, nausea/vomiting.  Also has bilateral flank pain.  In addition she reports even though she has been drinking she is only peed about twice a day.  Denies history of pyelonephritis.  No other concerns this time.   Urinary Tract Infection Associated symptoms: abdominal pain     Past Medical History:  Diagnosis Date   GERD (gastroesophageal reflux disease)    Hypercholesteremia    MI (myocardial infarction) (HCC) 2014   Migraines    "All the time"   Stroke [facility]) 2012   Some memory issues.  Some right sided weakness.   Wears dentures    Has partial upper and lower.  Doesn't wear.    There are no active problems to display for this patient.   Past Surgical History:  Procedure Laterality Date   CARDIAC SURGERY     CHOLECYSTECTOMY      OB History   No obstetric history on file.      Home Medications    Prior to Admission medications   Medication Sig Start Date End Date Taking? Authorizing Provider   albuterol (VENTOLIN HFA) 108 (90 Base) MCG/ACT inhaler Inhale 2 puffs into the lungs every 6 (six) hours as needed for wheezing or shortness of breath. 11/21/22  Yes Ellsworth Lennox, PA-C  apixaban (ELIQUIS) 5 MG TABS tablet Take by mouth. 04/19/21  Yes [provider]  aspirin 81 MG EC tablet Take by mouth.   Yes [provider]  atorvastatin (LIPITOR) 40 MG tablet Take 40 mg by mouth daily.   Yes [provider]  butalbital-acetaminophen-caffeine (FIORICET, ESGIC) 50-325-40 MG per tablet Take 1 tablet by mouth 2 (two) times daily as needed for headache.   Yes [provider]  cetirizine (ZYRTEC) 10 MG tablet Take 1 tablet (10 mg total) by mouth daily. 09/17/19  Yes Candis Schatz, PA-C  cyclobenzaprine (FLEXERIL) 10 MG tablet Take 1 tablet (10 mg total) by mouth at bedtime. 10/16/23  Yes Domenick Gong, MD  fluticasone (FLONASE) 50 MCG/ACT nasal spray Place 1 spray into both nostrils daily. 04/02/18  Yes Law, Waylan Boga, PA-C  furosemide (LASIX) 20 MG tablet Take 1 tablet by mouth daily as needed.   Yes [provider]  gabapentin (NEURONTIN) 600 MG tablet Take 600 mg by mouth 3 (three) times daily.   Yes [provider]  levonorgestrel (MIRENA) 20 MCG/24HR IUD 1 each by Intrauterine route once.   Yes [provider]  nitroGLYCERIN (NITROSTAT) 0.4 MG  SL tablet Place 0.4 mg under the tongue every 5 (five) minutes as needed for chest pain.   Yes [provider]  oxybutynin (DITROPAN-XL) 5 MG 24 hr tablet Take 5 mg by mouth daily.   Yes [provider]  pantoprazole (PROTONIX) 20 MG tablet Take 20 mg by mouth daily. 08/06/19  Yes [provider]  Spacer/Aero-Holding Chambers DEVI 1 Units by Does not apply route in the morning, at noon, and at bedtime. 11/21/22  Yes Ellsworth Lennox, PA-C  topiramate (TOPAMAX) 50 MG tablet Take 50 mg by mouth 2 (two) times daily.   Yes [provider]  triamcinolone (KENALOG)  0.1 % Apply 1 application topically 2 (two) times daily. 07/11/20  Yes Becky Augusta, NP  losartan (COZAAR) 25 MG tablet Take 25 mg by mouth daily.  09/17/19  [provider]    Family History Family History  Problem Relation Age of Onset   Other Mother        unknown medical history   Heart attack Father     Social History Social History   Tobacco Use   Smoking status: Never   Smokeless tobacco: Never  Vaping Use   Vaping status: Never Used  Substance Use Topics   Alcohol use: No   Drug use: No     Allergies   Amoxicillin, Penicillins, Cefdinir, Ivp dye [iodinated contrast media], Tape, Augmentin [amoxicillin-pot clavulanate], and Limonene   Review of Systems Review of Systems  Gastrointestinal:  Positive for abdominal pain.  Genitourinary:  Positive for dysuria.     Physical Exam Triage Vital Signs ED Triage Vitals  Encounter Vitals Group     BP 11/09/23 1528 127/89     Systolic BP Percentile --      Diastolic BP Percentile --      Pulse Rate 11/09/23 1528 (!) 109     Resp --      Temp 11/09/23 1528 98.5 F (36.9 C)     Temp Source 11/09/23 1528 Oral     SpO2 11/09/23 1528 98 %     Weight 11/09/23 1525 204 lb (92.5 kg)     Height --      Head Circumference --      Peak Flow --      Pain Score 11/09/23 1525 8     Pain Loc --      Pain Education --      Exclude from Growth Chart --    No data found.  Updated Vital Signs BP 127/89 (BP Location: Left Arm)   Pulse (!) 109   Temp 98.5 F (36.9 C) (Oral)   Wt 204 lb (92.5 kg)   SpO2 98%   BMI 35.02 kg/m   Visual Acuity Right Eye Distance:   Left Eye Distance:   Bilateral Distance:    Right Eye Near:   Left Eye Near:    Bilateral Near:     Physical Exam Vitals and nursing note reviewed.  Constitutional:      General: She is not in acute distress.    Appearance: Normal appearance. She is not ill-appearing.  HENT:     Head: Normocephalic and atraumatic.  Eyes:     Pupils: Pupils  are equal, round, and reactive to light.  Cardiovascular:     Rate and Rhythm: Tachycardia present.  Pulmonary:     Effort: Pulmonary effort is normal.  Abdominal:     General: Bowel sounds are normal.     Palpations: Abdomen is soft.  Tenderness: There is abdominal tenderness in the right lower quadrant and left upper quadrant. There is right CVA tenderness and left CVA tenderness.     Comments: Significant left upper quadrant tenderness with palpation and moderate right lower quadrant tenderness with palpation.  Skin:    General: Skin is warm and dry.  Neurological:     General: No focal deficit present.     Mental Status: She is alert and oriented to person, place, and time.  Psychiatric:        Mood and Affect: Mood normal.        Behavior: Behavior normal.      UC Treatments / Results  Labs (all labs ordered are listed, but only abnormal results are displayed) Labs Reviewed  URINALYSIS, W/ REFLEX TO CULTURE (INFECTION SUSPECTED) - Abnormal; Notable for the following components:      Result Value   APPearance HAZY (*)    Specific Gravity, Urine >1.030 (*)    Protein, ur 30 (*)    Leukocytes,Ua SMALL (*)    Bacteria, UA MANY (*)    All other components within normal limits  PREGNANCY, URINE    EKG   Radiology No results found.  Procedures Procedures (including critical care time)  Medications Ordered in UC Medications - No data to display  Initial Impression / Assessment and Plan / UC Course  I have reviewed the triage vital signs and the nursing notes.  Pertinent labs & imaging results that were available during my care of the patient were reviewed by me and considered in my medical decision making (see chart for details).     Presenting with abdominal pain/urinary symptoms after completing 7 days of Cipro.  This should have covered any pyelonephritis, UTI, or abdominal infection.  UA still shows leukocytes and bacteria.  Given her persistent symptoms  discussed failed outpatient treatment and advised her to return to the emergency room for further workup of her symptoms.  Patient is in agreement plan will go POV with her friend driving to the emergency room. Final Clinical Impressions(s) / UC Diagnoses   Final diagnoses:  Complicated UTI (urinary tract infection)  Left upper quadrant abdominal pain     Discharge Instructions      Please go to the emergency room for further evaluation of your symptoms    ED Prescriptions   None    PDMP not reviewed this encounter.   Radford Pax, NP 11/09/23 1608    Radford Pax, NP 11/09/23 [phone]

## 2023-11-09 NOTE — ED Notes (Signed)
 Patient is being discharged from the Urgent Care and sent to the Emergency Department via private vehicle . Per Cheri Rous, NP, patient is in need of higher level of care due to abdominal pain and tenderness. Patient is aware and verbalizes understanding of plan of care.  Vitals:   11/09/23 1528  BP: 127/89  Pulse: (!) 109  Temp: 98.5 F (36.9 C)  SpO2: 98%

## 2023-11-09 NOTE — Discharge Instructions (Signed)
 Please go to the emergency room for further evaluation of your symptoms

## 2023-11-09 NOTE — ED Triage Notes (Signed)
 Pt states that she went to [facility] last Friday due to a bladder infection and states that it was turning into a kidney infection.  Pt states that the pain has continued and that she has been having pain for over a week.

## 2023-11-22 ENCOUNTER — Ambulatory Visit (INDEPENDENT_AMBULATORY_CARE_PROVIDER_SITE_OTHER)

## 2023-11-22 ENCOUNTER — Ambulatory Visit
Admission: EM | Admit: 2023-11-22 | Discharge: 2023-11-22 | Disposition: A | Discharge status: Home / Self Care | Attending: Family Medicine | Admitting: Family Medicine

## 2023-11-22 DIAGNOSIS — J069 Acute upper respiratory infection, unspecified: Secondary | ICD-10-CM | POA: Diagnosis present

## 2023-11-22 DIAGNOSIS — R051 Acute cough: Secondary | ICD-10-CM | POA: Insufficient documentation

## 2023-11-22 DIAGNOSIS — J4521 Mild intermittent asthma with (acute) exacerbation: Secondary | ICD-10-CM | POA: Insufficient documentation

## 2023-11-22 LAB — RESP PANEL BY RT-PCR (FLU A&B, COVID) ARPGX2
Influenza A by PCR: NEGATIVE
Influenza B by PCR: NEGATIVE
SARS Coronavirus 2 by RT PCR: NEGATIVE

## 2023-11-22 MED ORDER — ALBUTEROL SULFATE HFA 108 (90 BASE) MCG/ACT IN AERS
2.0000 | INHALATION_SPRAY | RESPIRATORY_TRACT | 0 refills | Status: AC | PRN
Start: 2023-11-22 — End: ?

## 2023-11-22 MED ORDER — PREDNISONE 20 MG PO TABS: 40.0000 mg | ORAL_TABLET | Freq: Every day | ORAL | 0 refills | Status: AC

## 2023-11-22 MED ORDER — BENZONATATE 100 MG PO CAPS: 100.0000 mg | ORAL_CAPSULE | Freq: Three times a day (TID) | ORAL | 0 refills | Status: DC | PRN

## 2023-11-22 NOTE — Discharge Instructions (Addendum)
 On my review there is no pneumonia on your x-ray.  The radiologist will also read your x-ray, and if their interpretation differs significantly from mine, and the management of your condition would change, we will call you.  The COVID/flu test is negative.   Albuterol inhaler--do 2 puffs every 4 hours as needed for shortness of breath or wheezing  Take prednisone 20 mg--2 daily for 5 days; this is for inflammation in your lungs and it may also help with your sinus congestion  Take benzonatate 100 mg, 1 tab every 8 hours as needed for cough.

## 2023-11-22 NOTE — ED Triage Notes (Signed)
 Chief Complaint: ear pain, congestion, sore throat, nasal congestion, slight dry cough. Denies fever.   Sick exposure: Yes- Her friend was sick but not tested.   Onset: This past Monday   Prescriptions or OTC medications tried: Yes- Cold and Cough syrup, Vics rub, Vics cough syrup     with little relief  New foods, medications, or products: No  Recent Travel: No

## 2023-11-22 NOTE — ED Provider Notes (Addendum)
 [facility]    CSN: [id number] Arrival date & time: 11/22/23  1118      History   Chief Complaint Chief Complaint  Patient presents with   Nasal Congestion    HPI Joy Adams is a 43 y.o. female.   HPI Here for nasal congestion and cough.  Symptoms began on April 14.  Now she has more sinus pressure and a lot more postnasal drainage.  Her chest feels tight some, and she does have a history of asthma.  She has not noted any fever but she has had some chills.  No nausea vomiting or diarrhea.  There is some pain in her ears.  She is allergic to penicillins, cefdinir and IVP dye  Past medical history is negative for diabetes.  She is taking Eliquis.  She does have a history of asthma Past Medical History:  Diagnosis Date   GERD (gastroesophageal reflux disease)    Hypercholesteremia    MI (myocardial infarction) (HCC) 2014   Migraines    "All the time"   Stroke [facility]) 2012   Some memory issues.  Some right sided weakness.   Wears dentures    Has partial upper and lower.  Doesn't wear.    There are no active problems to display for this patient.   Past Surgical History:  Procedure Laterality Date   CARDIAC SURGERY     CHOLECYSTECTOMY      OB History   No obstetric history on file.      Home Medications    Prior to Admission medications   Medication Sig Start Date End Date Taking? Authorizing Provider  albuterol (VENTOLIN HFA) 108 (90 Base) MCG/ACT inhaler Inhale 2 puffs into the lungs every 4 (four) hours as needed for wheezing or shortness of breath. 11/22/23  Yes Jaelah Hauth K, MD  apixaban (ELIQUIS) 5 MG TABS tablet Take by mouth. 04/19/21  Yes [provider]  aspirin 81 MG EC tablet Take by mouth.   Yes [provider]  atorvastatin (LIPITOR) 40 MG tablet Take 40 mg by mouth daily.   Yes [provider]  benzonatate (TESSALON) 100 MG capsule Take 1 capsule (100 mg total) by mouth 3 (three) times daily as  needed for cough. 11/22/23  Yes Rhiann Boucher K, MD  butalbital-acetaminophen-caffeine (FIORICET, ESGIC) 50-325-40 MG per tablet Take 1 tablet by mouth 2 (two) times daily as needed for headache.   Yes [provider]  cetirizine (ZYRTEC) 10 MG tablet Take 1 tablet (10 mg total) by mouth daily. 09/17/19  Yes Harris, Michael D, PA-C  fluticasone (FLONASE) 50 MCG/ACT nasal spray Place 1 spray into both nostrils daily. 04/02/18  Yes Law, Alexandra M, PA-C  gabapentin (NEURONTIN) 600 MG tablet Take 600 mg by mouth 3 (three) times daily.   Yes [provider]  levonorgestrel (MIRENA) 20 MCG/24HR IUD 1 each by Intrauterine route once.   Yes [provider]  oxybutynin (DITROPAN-XL) 5 MG 24 hr tablet Take 5 mg by mouth daily.   Yes [provider]  pantoprazole (PROTONIX) 20 MG tablet Take 20 mg by mouth daily. 08/06/19  Yes [provider]  predniSONE (DELTASONE) 20 MG tablet Take 2 tablets (40 mg total) by mouth daily with breakfast for 5 days. 11/22/23 11/27/23 Yes Olga Seyler K, MD  Spacer/Aero-Holding Idelle Majors DEVI 1 Units by Does not apply route in the morning, at noon, and at bedtime. 11/21/22  Yes Gretel Leaven, PA-C  SYMBICORT 160-4.5 MCG/ACT inhaler Inhale 2 puffs  into the lungs.   Yes [provider]  topiramate (TOPAMAX) 50 MG tablet Take 50 mg by mouth 2 (two) times daily.   Yes [provider]  nitroGLYCERIN (NITROSTAT) 0.4 MG SL tablet Place 0.4 mg under the tongue every 5 (five) minutes as needed for chest pain.    [provider]  losartan (COZAAR) 25 MG tablet Take 25 mg by mouth daily.  09/17/19  [provider]    Family History Family History  Problem Relation Age of Onset   Other Mother        unknown medical history   Heart attack Father     Social History Social History   Tobacco Use   Smoking status: Never   Smokeless tobacco: Never  Vaping Use   Vaping status: Never Used  Substance Use  Topics   Alcohol use: No   Drug use: No     Allergies   Amoxicillin, Penicillins, Cefdinir, Ivp dye [iodinated contrast media], Tape, Augmentin [amoxicillin-pot clavulanate], and Limonene   Review of Systems Review of Systems   Physical Exam Triage Vital Signs ED Triage Vitals  Encounter Vitals Group     BP 11/22/23 1147 116/80     Systolic BP Percentile --      Diastolic BP Percentile --      Pulse Rate 11/22/23 1147 92     Resp 11/22/23 1147 18     Temp 11/22/23 1147 97.8 F (36.6 C)     Temp Source 11/22/23 1147 Oral     SpO2 11/22/23 1147 98 %     Weight --      Height --      Head Circumference --      Peak Flow --      Pain Score 11/22/23 1143 4     Pain Loc --      Pain Education --      Exclude from Growth Chart --    No data found.  Updated Vital Signs BP 116/80 (BP Location: Left Arm)   Pulse 92   Temp 97.8 F (36.6 C) (Oral)   Resp 18   LMP  (LMP Unknown)   SpO2 98%   Visual Acuity Right Eye Distance:   Left Eye Distance:   Bilateral Distance:    Right Eye Near:   Left Eye Near:    Bilateral Near:     Physical Exam Vitals reviewed.  Constitutional:      General: She is not in acute distress.    Appearance: She is not toxic-appearing.  HENT:     Right Ear: Tympanic membrane and ear canal normal.     Left Ear: Tympanic membrane and ear canal normal.     Nose: Congestion present.     Mouth/Throat:     Mouth: Mucous membranes are moist.     Comments: There is mild erythema of the tonsillar pillars.  There is no tonsillar.  There is clear exudate draining.  No asymmetry Eyes:     Extraocular Movements: Extraocular movements intact.     Conjunctiva/sclera: Conjunctivae normal.     Pupils: Pupils are equal, round, and reactive to light.  Cardiovascular:     Rate and Rhythm: Normal rate and regular rhythm.     Heart sounds: No murmur heard. Pulmonary:     Effort: Pulmonary effort is normal. No respiratory distress.     Breath sounds: No  wheezing, rhonchi or rales.  Chest:     Chest wall: No tenderness.  Musculoskeletal:     Cervical back: Neck supple.  Lymphadenopathy:     Cervical: No cervical adenopathy.  Skin:    Capillary Refill: Capillary refill takes less than 2 seconds.     Coloration: Skin is not jaundiced or pale.  Neurological:     General: No focal deficit present.     Mental Status: She is alert and oriented to person, place, and time.  Psychiatric:        Behavior: Behavior normal.      UC Treatments / Results  Labs (all labs ordered are listed, but only abnormal results are displayed) Labs Reviewed  RESP PANEL BY RT-PCR (FLU A&B, COVID) [id number]    EKG   Radiology No results found.  Procedures Procedures (including critical care time)  Medications Ordered in UC Medications - No data to display  Initial Impression / Assessment and Plan / UC Course  I have reviewed the triage vital signs and the nursing notes.  Pertinent labs & imaging results that were available during my care of the patient were reviewed by me and considered in my medical decision making (see chart for details).     Chest x-ray by my review is negative for infiltrate.  She is advised of radiology overread.  COVID/flu test is negative.  Prednisone for 5 days plus albuterol is sent in for the asthma exacerbation and Tessalon Perles are sent in for cough Final Clinical Impressions(s) / UC Diagnoses   Final diagnoses:  Acute cough  Viral upper respiratory tract infection  Mild intermittent asthma with acute exacerbation     Discharge Instructions      On my review there is no pneumonia on your x-ray.  The radiologist will also read your x-ray, and if their interpretation differs significantly from mine, and the management of your condition would change, we will call you.  The COVID/flu test is negative.   Albuterol inhaler--do 2 puffs every 4 hours as needed for shortness of breath or wheezing  Take  prednisone 20 mg--2 daily for 5 days; this is for inflammation in your lungs and it may also help with your sinus congestion  Take benzonatate 100 mg, 1 tab every 8 hours as needed for cough.      ED Prescriptions     Medication Sig Dispense Auth. Provider   albuterol (VENTOLIN HFA) 108 (90 Base) MCG/ACT inhaler Inhale 2 puffs into the lungs every 4 (four) hours as needed for wheezing or shortness of breath. 1 each Ann Keto, MD   predniSONE (DELTASONE) 20 MG tablet Take 2 tablets (40 mg total) by mouth daily with breakfast for 5 days. 10 tablet Satara Virella K, MD   benzonatate (TESSALON) 100 MG capsule Take 1 capsule (100 mg total) by mouth 3 (three) times daily as needed for cough. 21 capsule Bryanna Yim K, MD      PDMP not reviewed this encounter.   Ann Keto, MD 11/22/23 1303    Ann Keto, MD 11/22/23 [phone]

## 2024-01-03 ENCOUNTER — Ambulatory Visit: Admission: EM | Admit: 2024-01-03 | Discharge: 2024-01-03

## 2024-01-03 DIAGNOSIS — R0789 Other chest pain: Secondary | ICD-10-CM

## 2024-01-03 DIAGNOSIS — R42 Dizziness and giddiness: Secondary | ICD-10-CM | POA: Diagnosis not present

## 2024-01-03 DIAGNOSIS — R519 Headache, unspecified: Secondary | ICD-10-CM | POA: Diagnosis not present

## 2024-01-03 MED ORDER — ASPIRIN 81 MG PO CHEW
324.0000 mg | CHEWABLE_TABLET | Freq: Once | ORAL | Status: AC
  Administered 2024-01-03: 324 mg via ORAL

## 2024-01-03 NOTE — ED Notes (Signed)
 Patient is being discharged from the Urgent Care and sent to the Emergency Department via POV . Per Emmanuel Hark NP, patient is in need of higher level of care due to chest tightness & HA. Patient is aware and verbalizes understanding of plan of care.  Vitals:   01/03/24 1513  BP: (!) 131/90  Pulse: 89  Temp: 97.8 F (36.6 C)  SpO2: 99%

## 2024-01-03 NOTE — ED Triage Notes (Signed)
 Pt is with her best friend  Pt c/o chest tightness, fatigue, dizziness, headache, and cough x1week  Pt states that she feels like she is going to faint, but did not.  Pt states that she has a history of heart attacks and strokes  Pt states last heart attack was 10 years ago.  Pt has not taken any nitroglycerin.

## 2024-01-03 NOTE — ED Provider Notes (Signed)
 [facility]  Note:  This document was prepared using Conservation officer, historic buildings and may include unintentional dictation errors.  MRN: [id number] DOB: [birth year]  Subjective:   Joy Adams is a 43 y.o. female presenting for chest tightness, headache, dizziness, fatigue, cough x 1 week.  Patient reports that she feels like she will pass out but she does not.  Past history of MI and CVA.  MI was approximately 10 years ago.  Patient prescribed nitroglycerin for chest pain but did not take any prior to arrival today.  Patient states chest pain is worse with cough, denies any shortness of breath.  No current facility-administered medications for this encounter.  Current Outpatient Medications:    albuterol  (VENTOLIN  HFA) 108 (90 Base) MCG/ACT inhaler, Inhale 2 puffs into the lungs every 4 (four) hours as needed for wheezing or shortness of breath., Disp: 1 each, Rfl: 0   apixaban (ELIQUIS) 5 MG TABS tablet, Take by mouth., Disp: , Rfl:    aspirin 81 MG EC tablet, Take by mouth., Disp: , Rfl:    atorvastatin (LIPITOR) 40 MG tablet, Take 40 mg by mouth daily., Disp: , Rfl:    butalbital-acetaminophen -caffeine (FIORICET, ESGIC) 50-325-40 MG per tablet, Take 1 tablet by mouth 2 (two) times daily as needed for headache., Disp: , Rfl:    cetirizine  (ZYRTEC ) 10 MG tablet, Take 1 tablet (10 mg total) by mouth daily., Disp: 30 tablet, Rfl: 0   fluticasone  (FLONASE ) 50 MCG/ACT nasal spray, Place 1 spray into both nostrils daily., Disp: 1 g, Rfl: 0   gabapentin (NEURONTIN) 600 MG tablet, Take 600 mg by mouth 3 (three) times daily., Disp: , Rfl:    levonorgestrel (MIRENA) 20 MCG/24HR IUD, 1 each by Intrauterine route once., Disp: , Rfl:    nitroGLYCERIN (NITROSTAT) 0.4 MG SL tablet, Place 0.4 mg under the tongue every 5 (five) minutes as needed for chest pain., Disp: , Rfl:    oxybutynin (DITROPAN-XL) 5 MG 24 hr tablet, Take 5 mg by mouth daily., Disp: , Rfl:    pantoprazole (PROTONIX) 20  MG tablet, Take 20 mg by mouth daily., Disp: , Rfl:    Spacer/Aero-Holding Chambers DEVI, 1 Units by Does not apply route in the morning, at noon, and at bedtime., Disp: 1 Units, Rfl: 0   SYMBICORT 160-4.5 MCG/ACT inhaler, Inhale 2 puffs into the lungs., Disp: , Rfl:    topiramate (TOPAMAX) 50 MG tablet, Take 50 mg by mouth 2 (two) times daily., Disp: , Rfl:    benzonatate  (TESSALON ) 100 MG capsule, Take 1 capsule (100 mg total) by mouth 3 (three) times daily as needed for cough., Disp: 21 capsule, Rfl: 0   Allergies  Allergen Reactions   Amoxicillin Anaphylaxis   Penicillins Anaphylaxis   Cefdinir Hives   Ivp Dye [Iodinated Contrast Media] Itching and Nausea And Vomiting    OK if given benadryl    Tape     Tegaderm burns skin   Augmentin [Amoxicillin-Pot Clavulanate] Rash   Limonene Rash    Past Medical History:  Diagnosis Date   GERD (gastroesophageal reflux disease)    Hypercholesteremia    MI (myocardial infarction) (HCC) 2014   Migraines    "All the time"   Stroke [facility]) 2012   Some memory issues.  Some right sided weakness.   Wears dentures    Has partial upper and lower.  Doesn't wear.     Past Surgical History:  Procedure Laterality Date   CARDIAC SURGERY     CHOLECYSTECTOMY  Family History  Problem Relation Age of Onset   Other Mother        unknown medical history   Heart attack Father     Social History   Tobacco Use   Smoking status: Never   Smokeless tobacco: Never  Vaping Use   Vaping status: Never Used  Substance Use Topics   Alcohol use: No   Drug use: No    ROS Refer to HPI for ROS details.  Objective:   Vitals: BP (!) 131/90 (BP Location: Left Arm)   Pulse 89   Temp 97.8 F (36.6 C) (Oral)   Ht 5\' 1"  (1.549 m)   Wt 201 lb (91.2 kg)   SpO2 99%   BMI 37.98 kg/m   Physical Exam Vitals and nursing note reviewed.  Constitutional:      General: She is not in acute distress.    Appearance: She is well-developed. She is not  ill-appearing or toxic-appearing.  HENT:     Head: Normocephalic and atraumatic.     Nose: Nose normal.     Mouth/Throat:     Mouth: Mucous membranes are moist.  Eyes:     General:        Right eye: No discharge.        Left eye: No discharge.     Extraocular Movements: Extraocular movements intact.     Conjunctiva/sclera: Conjunctivae normal.  Cardiovascular:     Rate and Rhythm: Normal rate and regular rhythm.     Heart sounds: Normal heart sounds. No murmur heard. Pulmonary:     Effort: Pulmonary effort is normal. No respiratory distress.     Breath sounds: Normal breath sounds. No stridor. No wheezing, rhonchi or rales.  Musculoskeletal:        General: Normal range of motion.  Skin:    General: Skin is warm and dry.  Neurological:     General: No focal deficit present.     Mental Status: She is alert and oriented to person, place, and time.  Psychiatric:        Mood and Affect: Mood normal.        Behavior: Behavior normal.     Procedures  No results found for this or any previous visit (from the past 24 hours).  Assessment and Plan :     Discharge Instructions       1. Chest tightness (Primary) 2. Dizziness 3. Acute intractable headache, unspecified headache type - ED EKG performed in UC shows normal sinus rhythm, LVH, abnormal EKG, ventricular rate of 80 bpm, prior history of MI in CVA, no STEMI noted on EKG at this time. - aspirin chewable tablet 324 mg given in UC for acute chest pain and past history of MI and CVA. - Advised to go immediately to the emergency department for further evaluation and management due to prior history of MI and CVA, with dizziness, chest tightness, intractable headache. - EKG alone is not able to rule out cardiovascular event and based on history and current symptoms advanced imaging and stat blood test are recommended to fully evaluate potential causes of symptoms. - Please go to ER immediately upon leaving urgent care for  further evaluation.    Tenessa Marsee B Makinzey Banes   Macklin Jacquin, [place], [place] 01/03/24 1615

## 2024-01-03 NOTE — Discharge Instructions (Signed)
  1. Chest tightness (Primary) 2. Dizziness 3. Acute intractable headache, unspecified headache type - ED EKG performed in UC shows normal sinus rhythm, LVH, abnormal EKG, ventricular rate of 80 bpm, prior history of MI in CVA, no STEMI noted on EKG at this time. - aspirin chewable tablet 324 mg given in UC for acute chest pain and past history of MI and CVA. - Advised to go immediately to the emergency department for further evaluation and management due to prior history of MI and CVA, with dizziness, chest tightness, intractable headache. - EKG alone is not able to rule out cardiovascular event and based on history and current symptoms advanced imaging and stat blood test are recommended to fully evaluate potential causes of symptoms. - Please go to ER immediately upon leaving urgent care for further evaluation.

## 2024-02-26 ENCOUNTER — Other Ambulatory Visit: Payer: Self-pay

## 2024-02-26 ENCOUNTER — Emergency Department
Admission: EM | Admit: 2024-02-26 | Discharge: 2024-02-26 | Disposition: A | Discharge status: Home / Self Care | Attending: Emergency Medicine | Admitting: Emergency Medicine

## 2024-02-26 DIAGNOSIS — M62830 Muscle spasm of back: Secondary | ICD-10-CM | POA: Diagnosis present

## 2024-02-26 DIAGNOSIS — Z7901 Long term (current) use of anticoagulants: Secondary | ICD-10-CM | POA: Diagnosis not present

## 2024-02-26 DIAGNOSIS — I2510 Atherosclerotic heart disease of native coronary artery without angina pectoris: Secondary | ICD-10-CM | POA: Insufficient documentation

## 2024-02-26 DIAGNOSIS — Z7982 Long term (current) use of aspirin: Secondary | ICD-10-CM | POA: Diagnosis not present

## 2024-02-26 LAB — CBC
HCT: 43.6 % (ref 36.0–46.0)
Hemoglobin: 14.5 g/dL (ref 12.0–15.0)
MCH: 30.3 pg (ref 26.0–34.0)
MCHC: 33.3 g/dL (ref 30.0–36.0)
MCV: 91.2 fL (ref 80.0–100.0)
Platelets: 362 K/uL (ref 150–400)
RBC: 4.78 MIL/uL (ref 3.87–5.11)
RDW: 12.2 % (ref 11.5–15.5)
WBC: 10.1 K/uL (ref 4.0–10.5)
nRBC: 0 % (ref 0.0–0.2)

## 2024-02-26 LAB — BASIC METABOLIC PANEL WITH GFR
Anion gap: 10 (ref 5–15)
BUN: 8 mg/dL (ref 6–20)
CO2: 21 mmol/L — ABNORMAL LOW (ref 22–32)
Calcium: 8.6 mg/dL — ABNORMAL LOW (ref 8.9–10.3)
Chloride: 108 mmol/L (ref 98–111)
Creatinine, Ser: 0.78 mg/dL (ref 0.44–1.00)
GFR, Estimated: >60 mL/min (ref >60)
Glucose, Bld: 91 mg/dL (ref 70–99)
Potassium: 3.7 mmol/L (ref 3.5–5.1)
Sodium: 139 mmol/L (ref 135–145)

## 2024-02-26 LAB — TROPONIN I (HIGH SENSITIVITY): Troponin I (High Sensitivity): 5 ng/L (ref <18)

## 2024-02-26 MED ORDER — IBUPROFEN 800 MG PO TABS: 800.0000 mg | ORAL_TABLET | Freq: Three times a day (TID) | ORAL | 0 refills | Status: AC | PRN

## 2024-02-26 MED ORDER — KETOROLAC TROMETHAMINE 30 MG/ML IJ SOLN
60.0000 mg | Freq: Once | INTRAMUSCULAR | Status: AC
  Administered 2024-02-26: 60 mg via INTRAMUSCULAR
  Filled 2024-02-26: qty 2

## 2024-02-26 MED ORDER — LIDOCAINE 5 % EX PTCH
1.0000 | MEDICATED_PATCH | CUTANEOUS | Status: DC
  Administered 2024-02-26: 1 via TRANSDERMAL
  Filled 2024-02-26: qty 1

## 2024-02-26 MED ORDER — METHOCARBAMOL 500 MG PO TABS
500.0000 mg | ORAL_TABLET | Freq: Three times a day (TID) | ORAL | 0 refills | Status: AC | PRN
Start: 2024-02-26 — End: ?

## 2024-02-26 MED ORDER — LIDOCAINE 5 % EX PTCH: 1.0000 | MEDICATED_PATCH | CUTANEOUS | 0 refills | Status: AC

## 2024-02-26 MED ORDER — METHOCARBAMOL 500 MG PO TABS
500.0000 mg | ORAL_TABLET | Freq: Once | ORAL | Status: AC
  Administered 2024-02-26: 500 mg via ORAL
  Filled 2024-02-26: qty 1

## 2024-02-26 NOTE — Discharge Instructions (Signed)
 You may alternate over the counter Tylenol  1000 mg every 6 hours as needed for pain, fever and Ibuprofen  800 mg every 6-8 hours as needed for pain, fever.  Please take Ibuprofen  with food.  Do not take more than 4000 mg of Tylenol  (acetaminophen ) in a 24 hour period.  You may alternate heat, ice to this area.  I recommend massage, gentle stretching.

## 2024-02-26 NOTE — ED Provider Notes (Signed)
 [facility] Provider Note    Event Date/Time   First MD Initiated Contact with Patient 02/26/24 [phone]     (approximate)   History   Shoulder Pain and Headache   HPI  Joy Adams is a 43 y.o. female with history of CVA, CAD, migraines who presents to the emergency department with complaints of right trapezius muscle pain worse with turning her head, palpation.  Denies any known injury.  No numbness, tingling or weakness.  She took 1 Tylenol  at home without much relief.   History provided by patient, significant other.    Past Medical History:  Diagnosis Date   GERD (gastroesophageal reflux disease)    Hypercholesteremia    MI (myocardial infarction) (HCC) 2014   Migraines    All the time   Stroke [facility]) 2012   Some memory issues.  Some right sided weakness.   Wears dentures    Has partial upper and lower.  Doesn't wear.    Past Surgical History:  Procedure Laterality Date   CARDIAC SURGERY     CHOLECYSTECTOMY      MEDICATIONS:  Prior to Admission medications   Medication Sig Start Date End Date Taking? Authorizing Provider  albuterol  (VENTOLIN  HFA) 108 (90 Base) MCG/ACT inhaler Inhale 2 puffs into the lungs every 4 (four) hours as needed for wheezing or shortness of breath. 11/22/23   Banister, Pamela K, MD  apixaban (ELIQUIS) 5 MG TABS tablet Take by mouth. 04/19/21   [provider]  aspirin  81 MG EC tablet Take by mouth.    [provider]  atorvastatin (LIPITOR) 40 MG tablet Take 40 mg by mouth daily.    [provider]  benzonatate  (TESSALON ) 100 MG capsule Take 1 capsule (100 mg total) by mouth 3 (three) times daily as needed for cough. 11/22/23   Vonna Sharlet POUR, MD  butalbital-acetaminophen -caffeine (FIORICET, ESGIC) 50-325-40 MG per tablet Take 1 tablet by mouth 2 (two) times daily as needed for headache.    [provider]  cetirizine  (ZYRTEC ) 10 MG tablet Take 1 tablet (10 mg total) by mouth  daily. 09/17/19   Harris, Michael D, PA-C  fluticasone  (FLONASE ) 50 MCG/ACT nasal spray Place 1 spray into both nostrils daily. 04/02/18   Law, Alexandra M, PA-C  gabapentin (NEURONTIN) 600 MG tablet Take 600 mg by mouth 3 (three) times daily.    [provider]  levonorgestrel (MIRENA) 20 MCG/24HR IUD 1 each by Intrauterine route once.    [provider]  nitroGLYCERIN (NITROSTAT) 0.4 MG SL tablet Place 0.4 mg under the tongue every 5 (five) minutes as needed for chest pain.    [provider]  oxybutynin (DITROPAN-XL) 5 MG 24 hr tablet Take 5 mg by mouth daily.    [provider]  pantoprazole (PROTONIX) 20 MG tablet Take 20 mg by mouth daily. 08/06/19   [provider]  Spacer/Aero-Holding Raguel DEVI 1 Units by Does not apply route in the morning, at noon, and at bedtime. 11/21/22   Lynwood Lenis, PA-C  SYMBICORT 160-4.5 MCG/ACT inhaler Inhale 2 puffs into the lungs.    [provider]  topiramate (TOPAMAX) 50 MG tablet Take 50 mg by mouth 2 (two) times daily.    [provider]  losartan (COZAAR) 25 MG tablet Take 25 mg by mouth daily.  09/17/19  [provider]    Physical Exam   Triage Vital Signs: ED Triage Vitals  Encounter Vitals Group     BP  02/26/24 0111 (!) 155/84     Girls Systolic BP Percentile --      Girls Diastolic BP Percentile --      Boys Systolic BP Percentile --      Boys Diastolic BP Percentile --      Pulse Rate 02/26/24 0111 76     Resp 02/26/24 0111 18     Temp 02/26/24 0111 98.1 F (36.7 C)     Temp Source 02/26/24 0111 Oral     SpO2 02/26/24 0111 100 %     Weight 02/26/24 0109 (!) 307 lb (139.3 kg)     Height 02/26/24 0109 5' 1 (1.549 m)     Head Circumference --      Peak Flow --      Pain Score 02/26/24 0109 7     Pain Loc --      Pain Education --      Exclude from Growth Chart --     Most recent vital signs: Vitals:   02/26/24 0111 02/26/24 0505  BP: (!) 155/84 (!) 145/82   Pulse: 76 70  Resp: 18 18  Temp: 98.1 F (36.7 C)   SpO2: 100% 100%    CONSTITUTIONAL: Alert, responds appropriately to questions. Well-appearing; well-nourished HEAD: Normocephalic, atraumatic EYES: Conjunctivae clear, pupils appear equal, sclera nonicteric ENT: normal nose; moist mucous membranes NECK: Supple, normal ROM, tender to palpation over the right trapezius muscle which reproduces her pain.  No overlying skin changes.  No midline spinal tenderness or step-off or deformity. CARD: RRR; S1 and S2 appreciated RESP: Normal chest excursion without splinting or tachypnea; breath sounds clear and equal bilaterally; no wheezes, no rhonchi, no rales, no hypoxia or respiratory distress, speaking full sentences ABD/GI: Non-distended; soft, non-tender, no rebound, no guarding, no peritoneal signs BACK: The back appears normal EXT: Normal ROM in all joints; no deformity noted, no edema SKIN: Normal color for age and race; warm; no rash on exposed skin NEURO: Moves all extremities equally, normal speech, normal sensation, normal gait PSYCH: The patient's mood and manner are appropriate.   ED Results / Procedures / Treatments   LABS: (all labs ordered are listed, but only abnormal results are displayed) Labs Reviewed  BASIC METABOLIC PANEL WITH GFR - Abnormal; Notable for the following components:      Result Value   CO2 21 (*)    Calcium 8.6 (*)    All other components within normal limits  CBC  TROPONIN I (HIGH SENSITIVITY)     EKG:  EKG Interpretation Date/Time:  Tuesday February 26 2024 01:16:58 EDT Ventricular Rate:  70 PR Interval:  152 QRS Duration:  82 QT Interval:  452 QTC Calculation: 488 R Axis:   17  Text Interpretation: Normal sinus rhythm Inferior infarct (cited on or before 02-Apr-2022) Anterolateral infarct (cited on or before 02-Apr-2022) Abnormal ECG When compared with ECG of 03-Jan-2024 15:22, Questionable change in initial forces of Anterolateral leads  Confirmed by UNCONFIRMED, DOCTOR ([id number]), editor Alex Slough [phone]) on 02/26/2024 7:26:37 AM         RADIOLOGY: My personal review and interpretation of imaging:    I have personally reviewed all radiology reports.   No results found.   PROCEDURES:  Critical Care performed: No    Procedures    IMPRESSION / MDM / ASSESSMENT AND PLAN / ED COURSE  I reviewed the triage vital signs and the nursing notes.    Patient here with complaints of right trapezius pain.  No focal neurologic deficits.  No chest pain.  No injury.    DIFFERENTIAL DIAGNOSIS (includes but not limited to):   Trapezius muscle spasm, muscle strain, doubt cervical myelopathy, critical spinal stenosis, epidural abscess or hematoma, discitis or osteomyelitis, ACS, PE   Patient's presentation is most consistent with acute complicated illness / injury requiring diagnostic workup.   PLAN: EKG nonischemic.  I do not think this is her anginal equivalent.  Pain reproducible with palpation.  No focal neurodeficits.  No indication for emergent imaging.  Will give Toradol , Robaxin  for symptomatic relief.  Will discharge with prescriptions for anti-inflammatories, muscle relaxers, Lidoderm  patches.  Recommended stretching, alternating heat and ice, massage.  Patient comfortable with this plan.   MEDICATIONS GIVEN IN ED: Medications  ketorolac  (TORADOL ) 30 MG/ML injection 60 mg (60 mg Intramuscular Given 02/26/24 0500)  methocarbamol  (ROBAXIN ) tablet 500 mg (500 mg Oral Given 02/26/24 0500)     ED COURSE:  At this time, I do not feel there is any life-threatening condition present. I reviewed all nursing notes, vitals, pertinent previous records.  All lab and urine results, EKGs, imaging ordered have been independently reviewed and interpreted by myself.  I reviewed all available radiology reports from any imaging ordered this visit.  Based on my assessment, I feel the patient is safe to be discharged home without further  emergent workup and can continue workup as an outpatient as needed. Discussed all findings, treatment plan as well as usual and customary return precautions.  They verbalize understanding and are comfortable with this plan.  Outpatient follow-up has been provided as needed.  All questions have been answered.    CONSULTS:  none   OUTSIDE RECORDS REVIEWED: Reviewed last family medicine note on 11/02/2023.       FINAL CLINICAL IMPRESSION(S) / ED DIAGNOSES   Final diagnoses:  Spasm of right trapezius muscle     Rx / DC Orders   ED Discharge Orders          Ordered    ibuprofen  (ADVIL ) 800 MG tablet  Every 8 hours PRN        02/26/24 0437    methocarbamol  (ROBAXIN ) 500 MG tablet  Every 8 hours PRN        02/26/24 0437    lidocaine  (LIDODERM ) 5 %  Every 24 hours        02/26/24 0437             Note:  This document was prepared using Dragon voice recognition software and may include unintentional dictation errors.   Abayomi Pattison, Josette SAILOR, DO 02/27/24 [phone]

## 2024-02-26 NOTE — ED Triage Notes (Signed)
 Pt to ED via POV c/o right shoulder pain and headache x2 days. Pt denies any known injury. Hx of migraines. Denies CP, SOB, fevers, dizziness

## 2024-06-02 ENCOUNTER — Telehealth

## 2024-07-15 ENCOUNTER — Ambulatory Visit
Admission: EM | Admit: 2024-07-15 | Discharge: 2024-07-15 | Disposition: A | Discharge status: Home / Self Care | Attending: Emergency Medicine | Admitting: Emergency Medicine

## 2024-07-15 ENCOUNTER — Encounter: Payer: Self-pay | Admitting: Emergency Medicine

## 2024-07-15 DIAGNOSIS — R6889 Other general symptoms and signs: Secondary | ICD-10-CM | POA: Diagnosis not present

## 2024-07-15 DIAGNOSIS — I429 Cardiomyopathy, unspecified: Secondary | ICD-10-CM | POA: Insufficient documentation

## 2024-07-15 DIAGNOSIS — I639 Cerebral infarction, unspecified: Secondary | ICD-10-CM | POA: Insufficient documentation

## 2024-07-15 DIAGNOSIS — J029 Acute pharyngitis, unspecified: Secondary | ICD-10-CM | POA: Diagnosis not present

## 2024-07-15 DIAGNOSIS — J069 Acute upper respiratory infection, unspecified: Secondary | ICD-10-CM | POA: Diagnosis not present

## 2024-07-15 LAB — POC COVID19/FLU A&B COMBO
Covid Antigen, POC: NEGATIVE
Influenza A Antigen, POC: NEGATIVE
Influenza B Antigen, POC: NEGATIVE

## 2024-07-15 LAB — POCT RAPID STREP A (OFFICE): Rapid Strep A Screen: NEGATIVE

## 2024-07-15 MED ORDER — BENZONATATE 100 MG PO CAPS: 200.0000 mg | ORAL_CAPSULE | Freq: Three times a day (TID) | ORAL | 0 refills | Status: AC

## 2024-07-15 MED ORDER — IPRATROPIUM BROMIDE 0.06 % NA SOLN: 2.0000 | Freq: Four times a day (QID) | NASAL | 12 refills | Status: AC

## 2024-07-15 MED ORDER — PROMETHAZINE-DM 6.25-15 MG/5ML PO SYRP: 5.0000 mL | ORAL_SOLUTION | Freq: Four times a day (QID) | ORAL | 0 refills | Status: AC | PRN

## 2024-07-15 NOTE — ED Provider Notes (Signed)
 [facility]    CSN: [id number] Arrival date & time: 07/15/24  1454      History   Chief Complaint Chief Complaint  Patient presents with   Cough   Headache   Nausea   Sore Throat    HPI Joy Adams is a 43 y.o. female.   HPI  43 year old female past medical history difficult for migraine headaches, CVA, MI, high cholesterol, and GERD presents for evaluation of flulike symptoms that began 2 days ago.  These include nasal congestion, sore throat, headache, nonproductive cough, nausea, shortness breath, and wheezing.  She denies any fever or runny nose.  She is unaware of any known sick contacts though she did visit her mother in the hospital.  No recent travel out of state or country.  Past Medical History:  Diagnosis Date   GERD (gastroesophageal reflux disease)    Hypercholesteremia    MI (myocardial infarction) (HCC) 2014   Migraines    All the time   Stroke [facility]) 2012   Some memory issues.  Some right sided weakness.   Wears dentures    Has partial upper and lower.  Doesn't wear.    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   History of ST elevation myocardial infarction (STEMI) 11/17/2015   Encounter for routine checking of intrauterine contraceptive device (IUD) 08/12/2015   Simple ovarian cyst 08/12/2015   Personal history of thromboembolic disease 88/78/7983   Pregnancy of unknown anatomic location 06/28/2015   Gallstones 05/18/2015   Family history of hemochromatosis 02/12/2015   Migraine without aura  and without status migrainosus, not intractable 12/14/2014   Rotator cuff tendinitis, right 12/10/2014   ASD (atrial septal defect) 11/13/2014   Coagulation disorder 11/13/2014   Coronary artery disease involving native coronary artery of native heart with angina pectoris 11/13/2014   H/O: stroke 11/13/2014   Obesity 11/13/2014   Seasonal allergies 11/20/2012   Contraception, generic surveillance 11/18/2012   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      Home Medications    Prior to Admission medications   Medication Sig Start Date End Date Taking? Authorizing Provider  albuterol  (VENTOLIN  HFA) 108 (90 Base) MCG/ACT inhaler Inhale 2 puffs into the lungs every 4 (four) hours as needed for wheezing or shortness of breath. 11/22/23   Banister, Pamela K, MD  apixaban (ELIQUIS) 5 MG TABS tablet Take by mouth. 04/19/21   [provider]  aspirin  81 MG EC tablet Take by mouth.    [provider]  atorvastatin (LIPITOR) 40 MG tablet Take 40 mg by mouth daily.    [provider]  benzonatate  (TESSALON ) 100 MG capsule Take 2 capsules (200 mg total) by mouth every 8 (eight) hours. 07/15/24  Yes Bernardino Ditch, NP  butalbital-acetaminophen -caffeine (FIORICET, ESGIC) 50-325-40 MG per tablet Take 1  tablet by mouth 2 (two) times daily as needed for headache.    [provider]  cetirizine  (ZYRTEC ) 10 MG tablet Take 1 tablet (10 mg total) by mouth daily. 09/17/19   Harris, Michael D, PA-C  fluticasone  (FLONASE ) 50 MCG/ACT nasal spray Place 1 spray into both nostrils daily. 04/02/18   Law, Alexandra M, PA-C  gabapentin (NEURONTIN) 600 MG tablet Take 600 mg by mouth 3 (three) times daily.    [provider]  ibuprofen  (ADVIL ) 800  MG tablet Take 1 tablet (800 mg total) by mouth every 8 (eight) hours as needed. 02/26/24   Ward, Josette SAILOR, DO  ipratropium (ATROVENT ) 0.06 % nasal spray Place 2 sprays into both nostrils 4 (four) times daily. 07/15/24  Yes Bernardino Ditch, NP  levonorgestrel (MIRENA) 20 MCG/24HR IUD 1 each by Intrauterine route once.    [provider]  methocarbamol  (ROBAXIN ) 500 MG tablet Take 1 tablet (500 mg total) by mouth every 8 (eight) hours as needed. 02/26/24   Ward, Josette SAILOR, DO  nitroGLYCERIN (NITROSTAT) 0.4 MG SL tablet Place 0.4 mg under the tongue every 5 (five) minutes as needed for chest pain.    [provider]  oxybutynin (DITROPAN-XL) 5 MG 24 hr tablet Take 5 mg by mouth daily.    [provider]  pantoprazole (PROTONIX) 20 MG tablet Take 20 mg by mouth daily. 08/06/19   [provider]  promethazine -dextromethorphan (PROMETHAZINE -DM) 6.25-15 MG/5ML syrup Take 5 mLs by mouth 4 (four) times daily as needed. 07/15/24  Yes Bernardino Ditch, NP  Spacer/Aero-Holding Raguel DEVI 1 Units by Does not apply route in the morning, at noon, and at bedtime. 11/21/22   Lynwood Lenis, PA-C  SYMBICORT 160-4.5 MCG/ACT inhaler Inhale 2 puffs into the lungs.    [provider]  topiramate (TOPAMAX) 50 MG tablet Take 50 mg by mouth 2 (two) times daily.    [provider]  losartan (COZAAR) 25 MG tablet Take 25 mg by mouth daily.  09/17/19  [provider]    Family History Family History  Problem Relation Age of Onset   Other Mother        unknown medical history   Heart attack Father     Social History Social History   Tobacco Use   Smoking status: Never   Smokeless tobacco: Never  Vaping Use   Vaping status: Never Used  Substance Use Topics   Alcohol use: No   Drug use: No     Allergies   Amoxicillin, Penicillins, Tape, Amoxicillin-pot clavulanate, Cefdinir, Iodinated contrast media, and Limonene   Review of Systems Review of Systems   Constitutional:  Negative for fever.  HENT:  Positive for congestion, ear pain, rhinorrhea and sore throat.   Respiratory:  Positive for cough, shortness of breath and wheezing.   Gastrointestinal:  Positive for nausea.  Musculoskeletal:  Positive for arthralgias and myalgias.  Neurological:  Positive for headaches.     Physical Exam Triage Vital Signs ED Triage Vitals  Encounter Vitals Group     BP      Girls Systolic BP Percentile      Girls Diastolic BP Percentile      Boys Systolic BP Percentile      Boys Diastolic BP Percentile      Pulse      Resp      Temp      Temp src      SpO2      Weight  Height      Head Circumference      Peak Flow      Pain Score      Pain Loc      Pain Education      Exclude from Growth Chart    No data found.  Updated Vital Signs BP 138/85 (BP Location: Left Arm)   Pulse 88   Temp 98.7 F (37.1 C) (Oral)   Resp 18   Wt 215 lb 14.4 oz (97.9 kg)   SpO2 95%   BMI 40.79 kg/m   Visual Acuity Right Eye Distance:   Left Eye Distance:   Bilateral Distance:    Right Eye Near:   Left Eye Near:    Bilateral Near:     Physical Exam Vitals and nursing note reviewed.  Constitutional:      Appearance: Normal appearance. She is not ill-appearing.  HENT:     Head: Normocephalic and atraumatic.     Right Ear: Tympanic membrane, ear canal and external ear normal. There is no impacted cerumen.     Left Ear: Tympanic membrane, ear canal and external ear normal. There is no impacted cerumen.     Nose: Congestion and rhinorrhea present.     Comments: Nasal mucosa is edematous and erythematous with clear discharge in both nares.    Mouth/Throat:     Mouth: Mucous membranes are moist.     Pharynx: Oropharynx is clear. Posterior oropharyngeal erythema present. No oropharyngeal exudate.     Comments: Tonsillar pillars are unremarkable.  Posterior oropharynx demonstrates erythema with clear postnasal drip. Cardiovascular:     Rate and  Rhythm: Normal rate and regular rhythm.     Pulses: Normal pulses.     Heart sounds: Normal heart sounds. No murmur heard.    No friction rub. No gallop.  Pulmonary:     Effort: Pulmonary effort is normal.     Breath sounds: Normal breath sounds. No wheezing, rhonchi or rales.  Musculoskeletal:     Cervical back: Normal range of motion and neck supple. No tenderness.  Lymphadenopathy:     Cervical: No cervical adenopathy.  Skin:    General: Skin is warm and dry.     Capillary Refill: Capillary refill takes less than 2 seconds.     Findings: No rash.  Neurological:     General: No focal deficit present.     Mental Status: She is alert and oriented to person, place, and time.      UC Treatments / Results  Labs (all labs ordered are listed, but only abnormal results are displayed) Labs Reviewed  POCT RAPID STREP A (OFFICE) - Normal  POC COVID19/FLU A&B COMBO - Normal  CULTURE, GROUP A STREP [facility])    EKG   Radiology No results found.  Procedures Procedures (including critical care time)  Medications Ordered in UC Medications - No data to display  Initial Impression / Assessment and Plan / UC Course  I have reviewed the triage vital signs and the nursing notes.  Pertinent labs & imaging results that were available during my care of the patient were reviewed by me and considered in my medical decision making (see chart for details).   Patient is a nontoxic-appearing 43 year old female presenting for evaluation of flulike symptoms that started 2 days ago as outlined in HPI above.  In the exam room, she not in any acute distress.  Her physical exam does reveal some inflammation upper respiratory tract as evidenced by inflamed nasal mucosa  with scant clear nasal discharge in both nares.  Also erythema to the posterior pharynx with clear postnasal drip.  Cardiopulmonary exam physical lung sounds in all fields.  Differential diagnose include COVID, influenza, strep pharyngitis,  viral respiratory illness.  I will order a COVID and flu antigen test as well as a rapid strep.  Rapid strep is negative.  I will send swab for culture.  Respiratory antigen panel is negative for COVID and flu.  I will discharge patient with diagnosis of viral URI with a cough.  I will prescribe Atrovent  nasal spray for nasal congestion and Tessalon  Perles and Promethazine  DM cough syrup for cough and congestion.   Final Clinical Impressions(s) / UC Diagnoses   Final diagnoses:  Acute pharyngitis, unspecified etiology  Influenza-like symptoms  Viral URI with cough     Discharge Instructions      Your testing today was negative for COVID, influenza, and strep.  We will send your strep swab for culture.  I do believe you have a respiratory virus which is causing her symptoms.  Please use over-the-counter Tylenol  according to the package instructions as needed for any fever or pain.  Use the Atrovent  nasal spray, 2 squirts in each nostril every 6 hours, as needed for runny nose and postnasal drip.  Use the Tessalon  Perles every 8 hours during the day.  Take them with a small sip of water.  They may give you some numbness to the base of your tongue or a metallic taste in your mouth, this is normal.  Use the Promethazine  DM cough syrup at bedtime for cough and congestion.  It will make you drowsy so do not take it during the day.  Return for reevaluation or see your primary care provider for any new or worsening symptoms.      ED Prescriptions     Medication Sig Dispense Auth. Provider   benzonatate  (TESSALON ) 100 MG capsule Take 2 capsules (200 mg total) by mouth every 8 (eight) hours. 21 capsule Bernardino Ditch, NP   ipratropium (ATROVENT ) 0.06 % nasal spray Place 2 sprays into both nostrils 4 (four) times daily. 15 mL Bernardino Ditch, NP   promethazine -dextromethorphan (PROMETHAZINE -DM) 6.25-15 MG/5ML syrup Take 5 mLs by mouth 4 (four) times daily as needed. 118 mL Bernardino Ditch, NP       PDMP not reviewed this encounter.   Bernardino Ditch, NP 07/15/24 1723

## 2024-07-15 NOTE — Discharge Instructions (Addendum)
 Your testing today was negative for COVID, influenza, and strep.  We will send your strep swab for culture.  I do believe you have a respiratory virus which is causing her symptoms.  Please use over-the-counter Tylenol  according to the package instructions as needed for any fever or pain.  Use the Atrovent  nasal spray, 2 squirts in each nostril every 6 hours, as needed for runny nose and postnasal drip.  Use the Tessalon  Perles every 8 hours during the day.  Take them with a small sip of water.  They may give you some numbness to the base of your tongue or a metallic taste in your mouth, this is normal.  Use the Promethazine  DM cough syrup at bedtime for cough and congestion.  It will make you drowsy so do not take it during the day.  Return for reevaluation or see your primary care provider for any new or worsening symptoms.

## 2024-07-15 NOTE — ED Triage Notes (Signed)
 Pt c/o cough,HA,nausea & sore throat x2 days. Denies fevers. No OTC meds w/o relief.

## 2024-07-17 LAB — CULTURE, GROUP A STREP (THRC)

## 2024-08-20 ENCOUNTER — Encounter: Payer: Self-pay | Admitting: Pulmonary Disease

## 2024-09-15 ENCOUNTER — Ambulatory Visit: Admitting: Pulmonary Disease
